# Patient Record
Sex: Male | Born: 1990
Health system: Southern US, Community
[De-identification: ages and names within clinical notes are randomized; demographics above are authoritative.]

## PROBLEM LIST (undated history)

## (undated) DIAGNOSIS — J020 Streptococcal pharyngitis: Secondary | ICD-10-CM

## (undated) DIAGNOSIS — F1910 Other psychoactive substance abuse, uncomplicated: Secondary | ICD-10-CM

## (undated) DIAGNOSIS — W57XXXA Bitten or stung by nonvenomous insect and other nonvenomous arthropods, initial encounter: Secondary | ICD-10-CM

## (undated) DIAGNOSIS — R51 Headache: Secondary | ICD-10-CM

## (undated) DIAGNOSIS — F419 Anxiety disorder, unspecified: Secondary | ICD-10-CM

## (undated) DIAGNOSIS — N172 Acute kidney failure with medullary necrosis: Secondary | ICD-10-CM

## (undated) DIAGNOSIS — R519 Headache, unspecified: Secondary | ICD-10-CM

## (undated) DIAGNOSIS — F329 Major depressive disorder, single episode, unspecified: Secondary | ICD-10-CM

## (undated) DIAGNOSIS — N200 Calculus of kidney: Secondary | ICD-10-CM

## (undated) DIAGNOSIS — G43909 Migraine, unspecified, not intractable, without status migrainosus: Secondary | ICD-10-CM

## (undated) DIAGNOSIS — F32A Depression, unspecified: Secondary | ICD-10-CM

## (undated) DIAGNOSIS — G4700 Insomnia, unspecified: Secondary | ICD-10-CM

## (undated) HISTORY — DX: Streptococcal pharyngitis: J02.0

## (undated) HISTORY — DX: Anxiety disorder, unspecified: F41.9

## (undated) HISTORY — DX: Headache, unspecified: R51.9

## (undated) HISTORY — DX: Migraine, unspecified, not intractable, without status migrainosus: G43.909

## (undated) HISTORY — PX: RECONSTRUCTION OF NOSE: SHX2301

## (undated) HISTORY — PX: KIDNEY SURGERY: SHX687

## (undated) HISTORY — DX: Bitten or stung by nonvenomous insect and other nonvenomous arthropods, initial encounter: W57.XXXA

## (undated) HISTORY — DX: Headache: R51

## (undated) HISTORY — DX: Other psychoactive substance abuse, uncomplicated: F19.10

## (undated) HISTORY — PX: HAND SURGERY: SHX662

## (undated) HISTORY — DX: Depression, unspecified: F32.A

## (undated) HISTORY — DX: Insomnia, unspecified: G47.00

## (undated) HISTORY — DX: Major depressive disorder, single episode, unspecified: F32.9

---
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
## 2013-01-28 ENCOUNTER — Ambulatory Visit: Payer: Self-pay | Admitting: Family Medicine

## 2014-05-13 ENCOUNTER — Ambulatory Visit: Payer: Self-pay | Admitting: Internal Medicine

## 2015-02-04 ENCOUNTER — Ambulatory Visit: Admit: 2015-02-04 | Disposition: A | Discharge status: Home / Self Care | Payer: Self-pay | Admitting: Family Medicine

## 2016-11-06 ENCOUNTER — Ambulatory Visit
Admission: EM | Admit: 2016-11-06 | Discharge: 2016-11-06 | Disposition: A | Discharge status: Home / Self Care | Payer: Federal, State, Local not specified - PPO | Attending: Family Medicine | Admitting: Family Medicine

## 2016-11-06 DIAGNOSIS — R109 Unspecified abdominal pain: Secondary | ICD-10-CM

## 2016-11-06 HISTORY — DX: Calculus of kidney: N20.0

## 2016-11-06 LAB — URINALYSIS, COMPLETE (UACMP) WITH MICROSCOPIC
BACTERIA UA: NONE SEEN
Bilirubin Urine: NEGATIVE
GLUCOSE, UA: NEGATIVE mg/dL
Ketones, ur: NEGATIVE mg/dL
NITRITE: NEGATIVE
Protein, ur: NEGATIVE mg/dL
Specific Gravity, Urine: 1.015 (ref 1.005–1.030)
Squamous Epithelial / LPF: NONE SEEN
pH: 8.5 — ABNORMAL HIGH (ref 5.0–8.0)

## 2016-11-06 MED ORDER — ONDANSETRON 8 MG PO TBDP: 8.0000 mg | ORAL_TABLET | Freq: Two times a day (BID) | ORAL | 0 refills | Status: DC

## 2016-11-06 MED ORDER — HYDROCODONE-ACETAMINOPHEN 5-325 MG PO TABS: 1.0000 | ORAL_TABLET | Freq: Four times a day (QID) | ORAL | 0 refills | Status: DC | PRN

## 2016-11-06 MED ORDER — TAMSULOSIN HCL 0.4 MG PO CAPS: 0.4000 mg | ORAL_CAPSULE | Freq: Every day | ORAL | 0 refills | Status: DC

## 2016-11-06 NOTE — ED Triage Notes (Signed)
Pt has a hx of kidney stones and is being followed by Dr Achilles Dunkope, he was advised he if had any pain to visit an ED or Urgent Care, he woke this am in pain. Bilateral stones, however the left is the worse.

## 2016-11-06 NOTE — ED Provider Notes (Signed)
CSN: [id number]     Arrival date & time 11/06/16  0906 History   None    Chief Complaint  Patient presents with  . Flank Pain   (Consider location/radiation/quality/duration/timing/severity/associated sxs/prior Treatment) HPI  This a 26 year old male who has a history of kidney stones and is followed by Dr. Achilles Dunkope. Lately he has had bilateral flank pain. Dr. Achilles Dunkope was going to schedule a lithotripsy but it was dependent on the patient's ability to the excuse from work. He states that he awoke this morning with pain and per Dr. copes instructions came to the urgent care. His pain is left sided primarily. He has had no fever or chills. He's had some nausea and vomiting with 3-4 episodes since 8 AM this morning. He has been told not to have NSAIDs.Recent (10/30/2016)  CT of the abdomen pelvis - stone protocol of multiple bilateral upper and lower pole renal stones calculi measuring 0.4 cm the right kidney upper pole and up to.0.7 centimeters and the left kidney lower pole. No hydronephrosis.      Past Medical History:  Diagnosis Date  . Kidney stones    Past Surgical History:  Procedure Laterality Date  . HAND SURGERY Right    History reviewed. No pertinent family history. Social History  Substance Use Topics  . Smoking status: Current Every Day Smoker    Packs/day: 0.50    Types: Cigarettes  . Smokeless tobacco: Never Used  . Alcohol use Yes    Review of Systems  Constitutional: Positive for activity change. Negative for chills, fatigue and fever.  Gastrointestinal: Positive for nausea and vomiting.  Genitourinary: Positive for flank pain.  All other systems reviewed and are negative.   Allergies  Lunesta [eszopiclone]  Home Medications   Prior to Admission medications   Medication Sig Start Date End Date Taking? Authorizing Provider  promethazine (PHENERGAN) 12.5 MG tablet Take 12.5 mg by mouth every 4 (four) hours as needed for nausea or vomiting.   Yes Historical Provider,  MD  HYDROcodone-acetaminophen (NORCO/VICODIN) 5-325 MG tablet Take 1-2 tablets by mouth every 6 (six) hours as needed. 11/06/16   Lutricia FeilWilliam P Roemer, PA-C  ondansetron (ZOFRAN ODT) 8 MG disintegrating tablet Take 1 tablet (8 mg total) by mouth 2 (two) times daily. 11/06/16   Lutricia FeilWilliam P Roemer, PA-C  tamsulosin (FLOMAX) 0.4 MG CAPS capsule Take 1 capsule (0.4 mg total) by mouth daily. 11/06/16   Lutricia FeilWilliam P Roemer, PA-C   Meds Ordered and Administered this Visit  Medications - No data to display  BP 118/69 (BP Location: Left Arm)   Pulse 85   Temp 98.7 F (37.1 C) (Oral)   Resp 18   Ht 5\' 9"  (1.753 m)   Wt 125 lb (56.7 kg)   SpO2 100%   BMI 18.46 kg/m  No data found.   Physical Exam  Constitutional: He is oriented to person, place, and time. He appears well-developed and well-nourished. No distress.  HENT:  Head: Normocephalic and atraumatic.  Right Ear: External ear normal.  Left Ear: External ear normal.  Nose: Nose normal.  Mouth/Throat: Oropharynx is clear and moist. No oropharyngeal exudate.  Eyes: EOM are normal. Pupils are equal, round, and reactive to light. Right eye exhibits no discharge. Left eye exhibits no discharge.  Neck: Normal range of motion. Neck supple.  Cardiovascular: Normal rate, regular rhythm and intact distal pulses.  Exam reveals no gallop and no friction rub.   Murmur heard. Pulmonary/Chest: Effort normal and breath sounds normal. No respiratory  distress. He has no wheezes. He has no rales.  Abdominal: Soft. Bowel sounds are normal. He exhibits no distension and no mass. There is no tenderness. There is no rebound and no guarding.  Patient has mild CVA tenderness bilaterally.  Musculoskeletal: Normal range of motion.  Lymphadenopathy:    He has no cervical adenopathy.  Neurological: He is alert and oriented to person, place, and time.  Skin: Skin is warm and dry. He is not diaphoretic.  Psychiatric: He has a normal mood and affect. His behavior is normal.  Judgment and thought content normal.  Nursing note and vitals reviewed.   Urgent Care Course     Procedures (including critical care time)  Labs Review Labs Reviewed  URINALYSIS, COMPLETE (UACMP) WITH MICROSCOPIC - Abnormal; Notable for the following:       Result Value   Color, Urine STRAW (*)    pH 8.5 (*)    Hgb urine dipstick MODERATE (*)    Leukocytes, UA TRACE (*)    All other components within normal limits    Imaging Review No results found.   Visual Acuity Review  Right Eye Distance:   Left Eye Distance:   Bilateral Distance:    Right Eye Near:   Left Eye Near:    Bilateral Near:         MDM   1. Right flank pain   2. Left flank pain    Discharge Medication List as of 11/06/2016 10:46 AM    START taking these medications   Details  HYDROcodone-acetaminophen (NORCO/VICODIN) 5-325 MG tablet Take 1-2 tablets by mouth every 6 (six) hours as needed., Starting Mon 11/06/2016, Print    ondansetron (ZOFRAN ODT) 8 MG disintegrating tablet Take 1 tablet (8 mg total) by mouth 2 (two) times daily., Starting Mon 11/06/2016, Normal      Plan: 1. Test/x-ray results and diagnosis reviewed with patient 2. rx as per orders; risks, benefits, potential side effects reviewed with patient 3. Recommend supportive treatment with Increased hydration and rest. She will call Dr. Achilles Dunk his urologist today for scheduling of the lithotripsy and instructions. He has increasing pain or develops fever chills he should be seen in the emergency department immediately 4. F/u prn if symptoms worsen or don't improve     Lutricia Feil, PA-C 11/06/16 1058

## 2017-08-23 ENCOUNTER — Ambulatory Visit
Admission: EM | Admit: 2017-08-23 | Discharge: 2017-08-23 | Disposition: A | Discharge status: Home / Self Care | Payer: Federal, State, Local not specified - PPO | Attending: Family Medicine | Admitting: Family Medicine

## 2017-08-23 ENCOUNTER — Other Ambulatory Visit: Payer: Self-pay

## 2017-08-23 DIAGNOSIS — J029 Acute pharyngitis, unspecified: Secondary | ICD-10-CM | POA: Diagnosis not present

## 2017-08-23 DIAGNOSIS — J069 Acute upper respiratory infection, unspecified: Secondary | ICD-10-CM | POA: Diagnosis not present

## 2017-08-23 DIAGNOSIS — J329 Chronic sinusitis, unspecified: Secondary | ICD-10-CM

## 2017-08-23 DIAGNOSIS — R05 Cough: Secondary | ICD-10-CM | POA: Diagnosis not present

## 2017-08-23 DIAGNOSIS — B9789 Other viral agents as the cause of diseases classified elsewhere: Secondary | ICD-10-CM | POA: Diagnosis not present

## 2017-08-23 DIAGNOSIS — J342 Deviated nasal septum: Secondary | ICD-10-CM

## 2017-08-23 HISTORY — DX: Acute kidney failure with medullary necrosis: N17.2

## 2017-08-23 LAB — RAPID STREP SCREEN (MED CTR MEBANE ONLY): Streptococcus, Group A Screen (Direct): NEGATIVE

## 2017-08-23 MED ORDER — OSELTAMIVIR PHOSPHATE 75 MG PO CAPS: 75.0000 mg | ORAL_CAPSULE | Freq: Two times a day (BID) | ORAL | 0 refills | Status: DC

## 2017-08-23 MED ORDER — BENZONATATE 100 MG PO CAPS: 100.0000 mg | ORAL_CAPSULE | Freq: Three times a day (TID) | ORAL | 0 refills | Status: DC

## 2017-08-23 MED ORDER — CETIRIZINE HCL 10 MG PO TABS: 10.0000 mg | ORAL_TABLET | Freq: Every day | ORAL | 0 refills | Status: DC

## 2017-08-23 NOTE — ED Triage Notes (Signed)
Patient complains of sore throat, fever, chills that started last night. Patient states that he has kidney disease and is unable to mucinex.

## 2017-08-23 NOTE — Discharge Instructions (Signed)
-  symptoms likely viral in nature and possibly related to outside activity -cetirizine: one tablet daily for symptoms -Tamiflu: one tablet twice a day for 5 days -Tessalon: one every 8 hours as needed for cough -rest and fluids -return to clinic as needed should symptoms worsen or not improve

## 2017-08-23 NOTE — ED Provider Notes (Signed)
[facility]    CSN: [id number] Arrival date & time: 08/23/17  [id number]     History   Chief Complaint Chief Complaint  Patient presents with  . Sore Throat    HPI David [name] is a 26 y.o. male.   Pt is a 26 year old male with PMH of renal disease and heart murmur who presents with complaints of sore throat, headache, and chills that started yesterday afternoon. 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 Patient denies any fever or shortness of breath. He does report some left ear discomfort/popping also reports a cough with some runny nose but no congestion. Does report some drainage as well. Patient does report some mild chest pain he describes as mild heartburn-like.  Patient has allergies Lunesta and is unable take guaifenesin due to his renal disease.      Past Medical History:  Diagnosis Date  . Kidney papillary necrosis (HCC)   . Kidney stones     There are no active problems to display for this patient.   Past Surgical History:  Procedure Laterality Date  . HAND SURGERY Right   . RECONSTRUCTION OF NOSE         Home Medications    Prior to Admission medications   Medication Sig Start Date End Date Taking? Authorizing Provider  benzonatate (TESSALON) 100 MG capsule Take 1 capsule (100 mg total) every 8 (eight) hours by mouth. 08/23/17   Candis SchatzHarris, Kooper Chriswell D, PA-C  cetirizine (ZYRTEC) 10 MG tablet Take 1 tablet (10 mg total) daily by mouth. 08/23/17   Candis SchatzHarris, Cagney Steenson D, PA-C  oseltamivir (TAMIFLU) 75 MG capsule Take 1 capsule (75 mg total) every 12 (twelve) hours by mouth. 08/23/17   Candis SchatzHarris, Shaday Rayborn D, PA-C    Family History History reviewed. No pertinent family history.  Social History Social History   Tobacco Use  . Smoking status: Current Every Day Smoker    Packs/day: 0.50    Types: Cigarettes  . Smokeless tobacco: Never Used  Substance Use Topics  . Alcohol use: Yes    Comment: socially  . Drug  use: No     Allergies   Lunesta [eszopiclone]   Review of Systems Review of Systems  As noted above in history of present illness. Other systems reviewed and found to be negative.   Physical Exam Triage Vital Signs ED Triage Vitals  Enc Vitals Group     BP 08/23/17 0828 126/90     Pulse Rate 08/23/17 0828 78     Resp 08/23/17 0828 18     Temp 08/23/17 0828 98.2 F (36.8 C)     Temp Source 08/23/17 0828 Oral     SpO2 08/23/17 0828 100 %     Weight 08/23/17 0825 125 lb (56.7 kg)     Height 08/23/17 0825 5\' 9"  (1.753 m)     Head Circumference --      Peak Flow --      Pain Score 08/23/17 0825 6     Pain Loc --      Pain Edu? --      Excl. in GC? --    No data found.  Updated Vital Signs BP 126/90 (BP Location: Right Arm)   Pulse 78   Temp 98.2 F (36.8 C) (Oral)   Resp 18   Ht 5\' 9"  (1.753 m)   Wt 125 lb (56.7 kg)   SpO2 100%   BMI 18.46 kg/m   Visual  Acuity Right Eye Distance:   Left Eye Distance:   Bilateral Distance:    Right Eye Near:   Left Eye Near:    Bilateral Near:     Physical Exam  Constitutional: He appears well-developed and well-nourished. He does not appear ill. No distress.  HENT:  Head: Normocephalic and atraumatic.  Mouth/Throat: Mucous membranes are normal. Posterior oropharyngeal erythema present.  Bilateral bulging TMs. Positive postnasal drainage. Some maxillary and frontal sinus pressure.  Eyes: EOM are normal. Pupils are equal, round, and reactive to light.  Neck: Neck supple.  Cardiovascular: Normal rate and regular rhythm.  Murmur heard. Pulmonary/Chest: Effort normal. No respiratory distress. He has no wheezes. He has no rhonchi.  Abdominal: Soft.  Lymphadenopathy:    He has cervical adenopathy (n the left).  Neurological: He is alert.  Skin: Skin is warm and dry. Capillary refill takes less than 2 seconds.     UC Treatments / Results  Labs (all labs ordered are listed, but only abnormal results are displayed) Labs  Reviewed  RAPID STREP SCREEN (NOT AT [facility])  CULTURE, GROUP A STREP [facility])    EKG  EKG Interpretation None       Radiology No results found.  Procedures Procedures (including critical care time)  Medications Ordered in UC Medications - No data to display   Initial Impression / Assessment and Plan / UC Course  I have reviewed the triage vital signs and the nursing notes.  Pertinent labs & imaging results that were available during my care of the patient were reviewed by me and considered in my medical decision making (see chart for details).    Pt with URI symptoms x one day. Strep negative.   Final Clinical Impressions(s) / UC Diagnoses   Final diagnoses:  Viral URI with cough  Sore throat   Will give prescription to cover his cough and upper respiratory symptoms. Also going give him a prescription for Tamiflu. Also recommend rest and fluids. Patient return to clinic should his symptoms worsen or not improve.  ED Discharge Orders        Ordered    cetirizine (ZYRTEC) 10 MG tablet  Daily     08/23/17 0849    oseltamivir (TAMIFLU) 75 MG capsule  Every 12 hours     08/23/17 0850    benzonatate (TESSALON) 100 MG capsule  Every 8 hours     08/23/17 0851       Controlled Substance Prescriptions [place] Controlled Substance Registry consulted? Not Applicable   Candis SchatzHarris, Carmina Walle D, PA-C 08/23/17 [id number]

## 2017-08-25 LAB — CULTURE, GROUP A STREP (THRC)

## 2017-09-03 ENCOUNTER — Telehealth: Payer: Self-pay

## 2017-09-03 NOTE — Telephone Encounter (Signed)
Patient called in for lab results. Lab results given to patient and patient little improvement. Offered antibiotic for + strep but patient declined since he did not feel it was an emergency. [facility]

## 2017-10-01 ENCOUNTER — Encounter: Payer: Self-pay | Admitting: Emergency Medicine

## 2017-10-01 ENCOUNTER — Other Ambulatory Visit: Payer: Self-pay

## 2017-10-01 ENCOUNTER — Emergency Department
Admission: EM | Admit: 2017-10-01 | Discharge: 2017-10-01 | Disposition: A | Discharge status: Home / Self Care | Attending: Emergency Medicine | Admitting: Emergency Medicine

## 2017-10-01 DIAGNOSIS — Y929 Unspecified place or not applicable: Secondary | ICD-10-CM | POA: Diagnosis not present

## 2017-10-01 DIAGNOSIS — Y990 Civilian activity done for income or pay: Secondary | ICD-10-CM | POA: Insufficient documentation

## 2017-10-01 DIAGNOSIS — S0181XA Laceration without foreign body of other part of head, initial encounter: Secondary | ICD-10-CM | POA: Diagnosis not present

## 2017-10-01 DIAGNOSIS — T148XXA Other injury of unspecified body region, initial encounter: Secondary | ICD-10-CM

## 2017-10-01 DIAGNOSIS — Y9389 Activity, other specified: Secondary | ICD-10-CM | POA: Diagnosis not present

## 2017-10-01 DIAGNOSIS — Z23 Encounter for immunization: Secondary | ICD-10-CM | POA: Insufficient documentation

## 2017-10-01 DIAGNOSIS — F17210 Nicotine dependence, cigarettes, uncomplicated: Secondary | ICD-10-CM | POA: Insufficient documentation

## 2017-10-01 DIAGNOSIS — S0993XA Unspecified injury of face, initial encounter: Secondary | ICD-10-CM | POA: Diagnosis present

## 2017-10-01 DIAGNOSIS — W540XXA Bitten by dog, initial encounter: Secondary | ICD-10-CM | POA: Diagnosis not present

## 2017-10-01 DIAGNOSIS — Z79899 Other long term (current) drug therapy: Secondary | ICD-10-CM | POA: Diagnosis not present

## 2017-10-01 MED ORDER — ACETAMINOPHEN 325 MG PO TABS
650.0000 mg | ORAL_TABLET | Freq: Once | ORAL | Status: AC
  Administered 2017-10-01: 650 mg via ORAL
  Filled 2017-10-01: qty 2

## 2017-10-01 MED ORDER — TETANUS-DIPHTH-ACELL PERTUSSIS 5-2.5-18.5 LF-MCG/0.5 IM SUSP
0.5000 mL | Freq: Once | INTRAMUSCULAR | Status: AC
  Administered 2017-10-01: 0.5 mL via INTRAMUSCULAR
  Filled 2017-10-01: qty 0.5

## 2017-10-01 MED ORDER — AMOXICILLIN-POT CLAVULANATE 875-125 MG PO TABS: 1.0000 | ORAL_TABLET | Freq: Two times a day (BID) | ORAL | 0 refills | Status: AC

## 2017-10-01 MED ORDER — LIDOCAINE-EPINEPHRINE-TETRACAINE (LET) SOLUTION
NASAL | Status: AC
  Filled 2017-10-01: qty 3

## 2017-10-01 MED ORDER — LIDOCAINE-EPINEPHRINE-TETRACAINE (LET) SOLUTION: 3.0000 mL | Freq: Once | NASAL | Status: DC

## 2017-10-01 NOTE — ED Provider Notes (Signed)
[facility] Emergency Department Provider Note  ____________________________________________  Time seen: Approximately 5:04 PM  I have reviewed the triage vital signs and the nursing notes.   HISTORY  Chief Complaint WC Animal Bite    HPI David [name] is a 26 y.o. male presents to the emergency department with 1 facial laceration and to facial abrasions after being bitten by a great Dane. The dog in question was able to be detained and observed for signs of rabies.  Patient is complaining of mild right-sided jaw pain but states that he "does not want x-rays or CT.  He denies changes in sensation.  No alleviating measures have been attempted.   Past Medical History:  Diagnosis Date  . Kidney papillary necrosis (HCC)   . Kidney stones     There are no active problems to display for this patient.   Past Surgical History:  Procedure Laterality Date  . HAND SURGERY Right   . RECONSTRUCTION OF NOSE      Prior to Admission medications   Medication Sig Start Date End Date Taking? Authorizing Provider  amoxicillin-clavulanate (AUGMENTIN) 875-125 MG tablet Take 1 tablet by mouth 2 (two) times daily for 10 days. 10/01/17 10/11/17  Orvil FeilWoods, Afnan Cadiente M, PA-C  benzonatate (TESSALON) 100 MG capsule Take 1 capsule (100 mg total) every 8 (eight) hours by mouth. 08/23/17   Candis SchatzHarris, Michael D, PA-C  cetirizine (ZYRTEC) 10 MG tablet Take 1 tablet (10 mg total) daily by mouth. 08/23/17   Candis SchatzHarris, Michael D, PA-C  oseltamivir (TAMIFLU) 75 MG capsule Take 1 capsule (75 mg total) every 12 (twelve) hours by mouth. 08/23/17   Candis SchatzHarris, Michael D, PA-C    Allergies Lunesta [eszopiclone]  No family history on file.  Social History Social History   Tobacco Use  . Smoking status: Current Every Day Smoker    Packs/day: 0.50    Types: Cigarettes  . Smokeless tobacco: Never Used  Substance Use Topics  . Alcohol use: Yes    Comment: socially  . Drug use: No     Review  of Systems  Constitutional: No fever/chills Eyes: No visual changes. No discharge ENT: No upper respiratory complaints. Cardiovascular: no chest pain. Respiratory: no cough. No SOB. Gastrointestinal: No abdominal pain.  No nausea, no vomiting.  No diarrhea.  No constipation. Musculoskeletal: Negative for musculoskeletal pain. Skin: Negative for rash, abrasions, lacerations, ecchymosis. Neurological: Negative for headaches, focal weakness or numbness.   ____________________________________________   PHYSICAL EXAM:  VITAL SIGNS: ED Triage Vitals [10/01/17 1553]  Enc Vitals Group     BP (!) 146/86     Pulse Rate 85     Resp 18     Temp 98 F (36.7 C)     Temp Source Oral     SpO2 100 %     Weight 125 lb (56.7 kg)     Height      Head Circumference      Peak Flow      Pain Score 6     Pain Loc      Pain Edu?      Excl. in GC?      Constitutional: Alert and oriented. Well appearing and in no acute distress. Eyes: Conjunctivae are normal. PERRL. EOMI. Head: Atraumatic. ENT:      Ears: TMs are pearly bilaterally.       Nose: No congestion/rhinnorhea.      Mouth/Throat: Mucous membranes are moist.  Hematological/Lymphatic/Immunilogical: No cervical lymphadenopathy. Cardiovascular: Normal rate, regular rhythm.  Normal S1 and S2.  Good peripheral circulation. Respiratory: Normal respiratory effort without tachypnea or retractions. Lungs CTAB. Good air entry to the bases with no decreased or absent breath sounds. Musculoskeletal: Full range of motion to all extremities. No gross deformities appreciated. Neurologic:  Normal speech and language. No gross focal neurologic deficits are appreciated.  Skin: Patient has 2 facial abrasions and one puncture along the right face.  ____________________________________________   LABS (all labs ordered are listed, but only abnormal results are displayed)  Labs Reviewed - No data to  display ____________________________________________  EKG   ____________________________________________  RADIOLOGY   No results found.  ____________________________________________    PROCEDURES  Procedure(s) performed:    Procedures  LACERATION REPAIR Performed by: Orvil FeilJaclyn M Lashia Niese Authorized by: Orvil FeilJaclyn M Hagan Maltz Consent: Verbal consent obtained. Risks and benefits: risks, benefits and alternatives were discussed Consent given by: patient Patient identity confirmed: provided demographic data Prepped and Draped in normal sterile fashion Wound explored  Laceration Location: Right jaw  Laceration Length: 1 cm  No Foreign Bodies seen or palpated  Anesthesia: local infiltration  Local anesthetic: LET  Anesthetic total: 3 ml  Irrigation method: syringe Amount of cleaning: standard  Skin closure: 5-0 Ethilon   Number of sutures: 3  Technique: Simple Interrupted   Patient tolerance: Patient tolerated the procedure well with no immediate complications.   Medications  lidocaine-EPINEPHrine-tetracaine (LET) solution (not administered)  Tdap (BOOSTRIX) injection 0.5 mL (0.5 mLs Intramuscular Given 10/01/17 1802)  acetaminophen (TYLENOL) tablet 650 mg (650 mg Oral Given 10/01/17 1807)     ____________________________________________   INITIAL IMPRESSION / ASSESSMENT AND PLAN / ED COURSE  Pertinent labs & imaging results that were available during my care of the patient were reviewed by me and considered in my medical decision making (see chart for details).  Review of the [place] CSRS was performed in accordance of the [facility] prior to dispensing any controlled drugs.     Assessment and Plan: Animal Bite  Patient presents to the emergency department with a dog bite sustained to the face.  Patient underwent laceration repair without complication.  He was discharged with Augmentin and advised to follow-up with primary care for suture removal in 5 days.  Tetanus  status was updated in the ED.  Animal involved in dog bite can be detained for observation and rabies vaccination is not necessary at this time.  Return precautions were given.  All patient questions were answered.   ____________________________________________  FINAL CLINICAL IMPRESSION(S) / ED DIAGNOSES  Final diagnoses:  Animal bite      NEW MEDICATIONS STARTED DURING THIS VISIT:  ED Discharge Orders        Ordered    amoxicillin-clavulanate (AUGMENTIN) 875-125 MG tablet  2 times daily     10/01/17 1740          This chart was dictated using voice recognition software/Dragon. Despite best efforts to proofread, errors can occur which can change the meaning. Any change was purely unintentional.    Orvil FeilWoods, Dontrey Snellgrove M, PA-C 10/01/17 Vallery [id number]    Quale, Mark, MD 10/11/17 [id number]

## 2017-10-01 NOTE — ED Notes (Signed)
Pt reports that today while working he was bitten in the face by a [place]. Family at bedside. Pt believes dog shots are UTD. Pt has scar on right side of face on cheek by mouth

## 2017-10-01 NOTE — ED Triage Notes (Signed)
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 Pt states he believes dog is UTD on vaccines. [employer] contacting [employer] to initiate report. Pt presents with wound to right cheek and chin. Bleeding controlled. Ambulatory to triage. No apparent distress noted.

## 2017-10-02 ENCOUNTER — Telehealth: Payer: Self-pay | Admitting: Emergency Medicine

## 2017-10-02 NOTE — Telephone Encounter (Signed)
Pt seen here yesterday, called today requesting work note for tomorrow. Returned call and left message to return my call.

## 2017-10-31 ENCOUNTER — Telehealth: Payer: Self-pay | Admitting: Internal Medicine

## 2017-10-31 NOTE — Telephone Encounter (Signed)
Please advise 

## 2017-10-31 NOTE — Telephone Encounter (Signed)
Copied from CRM [id number]. Topic: Quick Communication - See Telephone Encounter >> Oct 31, 2017 10:23 AM Rudi CocoLathan, Bayani Renteria M, NT wrote: CRM for notification. See Telephone encounter for:   10/31/17. Pt. Calling to see if he will be able to become a new pt. Of Dr. Darrick Huntsmanullo his spouse is a pt. Of hers. Pt. Has untied health care insurance pt. Can be reached at [phone]

## 2017-11-06 NOTE — Telephone Encounter (Signed)
Pt is wanting to see if he can become a pt of yours? Pt's spouse is a pt of yours, Rayvon CharMeghan Norris.

## 2017-11-06 NOTE — Telephone Encounter (Signed)
No more patients at this time sorry

## 2017-11-06 NOTE — Telephone Encounter (Signed)
Spoke with pt and informed him that Dr. Darrick Huntsmanullo is not accepting pt's at this time. The pt stated that he would like to establish care with Dr. Shirlee LatchMclean.

## 2017-12-17 ENCOUNTER — Ambulatory Visit (INDEPENDENT_AMBULATORY_CARE_PROVIDER_SITE_OTHER): Payer: 59 | Admitting: Internal Medicine

## 2017-12-17 ENCOUNTER — Encounter: Payer: Self-pay | Admitting: Internal Medicine

## 2017-12-17 VITALS — BP 128/84 | HR 77 | Temp 97.9°F | Ht 69.0 in | Wt 139.8 lb

## 2017-12-17 DIAGNOSIS — N172 Acute kidney failure with medullary necrosis: Secondary | ICD-10-CM

## 2017-12-17 DIAGNOSIS — E559 Vitamin D deficiency, unspecified: Secondary | ICD-10-CM | POA: Diagnosis not present

## 2017-12-17 DIAGNOSIS — Z1159 Encounter for screening for other viral diseases: Secondary | ICD-10-CM

## 2017-12-17 DIAGNOSIS — G4700 Insomnia, unspecified: Secondary | ICD-10-CM

## 2017-12-17 DIAGNOSIS — F5104 Psychophysiologic insomnia: Secondary | ICD-10-CM | POA: Insufficient documentation

## 2017-12-17 DIAGNOSIS — Z13818 Encounter for screening for other digestive system disorders: Secondary | ICD-10-CM | POA: Diagnosis not present

## 2017-12-17 DIAGNOSIS — R748 Abnormal levels of other serum enzymes: Secondary | ICD-10-CM

## 2017-12-17 DIAGNOSIS — Z1329 Encounter for screening for other suspected endocrine disorder: Secondary | ICD-10-CM | POA: Diagnosis not present

## 2017-12-17 DIAGNOSIS — F1911 Other psychoactive substance abuse, in remission: Secondary | ICD-10-CM

## 2017-12-17 DIAGNOSIS — F419 Anxiety disorder, unspecified: Secondary | ICD-10-CM | POA: Insufficient documentation

## 2017-12-17 DIAGNOSIS — Z13220 Encounter for screening for lipoid disorders: Secondary | ICD-10-CM | POA: Diagnosis not present

## 2017-12-17 DIAGNOSIS — Z87898 Personal history of other specified conditions: Secondary | ICD-10-CM | POA: Diagnosis not present

## 2017-12-17 DIAGNOSIS — I341 Nonrheumatic mitral (valve) prolapse: Secondary | ICD-10-CM

## 2017-12-17 MED ORDER — TEMAZEPAM 15 MG PO CAPS: 15.0000 mg | ORAL_CAPSULE | Freq: Every evening | ORAL | 0 refills | Status: DC | PRN

## 2017-12-17 NOTE — Patient Instructions (Addendum)
sch fasting labs tomorrow  I want to see you in 2-3 weeks   Generalized Anxiety Disorder, Adult Generalized anxiety disorder (GAD) is a mental health disorder. People with this condition constantly worry about everyday events. Unlike normal anxiety, worry related to GAD is not triggered by a specific event. These worries also do not fade or get better with time. GAD interferes with life functions, including relationships, work, and school. GAD can vary from mild to severe. People with severe GAD can have intense waves of anxiety with physical symptoms (panic attacks). What are the causes? The exact cause of GAD is not known. What increases the risk? This condition is more likely to develop in:  Women.  People who have a family history of anxiety disorders.  People who are very shy.  People who experience very stressful life events, such as the death of a loved one.  People who have a very stressful family environment.  What are the signs or symptoms? People with GAD often worry excessively about many things in their lives, such as their health and family. They may also be overly concerned about:  Doing well at work.  Being on time.  Natural disasters.  Friendships.  Physical symptoms of GAD include:  Fatigue.  Muscle tension or having muscle twitches.  Trembling or feeling shaky.  Being easily startled.  Feeling like your heart is pounding or racing.  Feeling out of breath or like you cannot take a deep breath.  Having trouble falling asleep or staying asleep.  Sweating.  Nausea, diarrhea, or irritable bowel syndrome (IBS).  Headaches.  Trouble concentrating or remembering facts.  Restlessness.  Irritability.  How is this diagnosed? Your health care provider can diagnose GAD based on your symptoms and medical history. You will also have a physical exam. The health care provider will ask specific questions about your symptoms, including how severe they are,  when they started, and if they come and go. Your health care provider may ask you about your use of alcohol or drugs, including prescription medicines. Your health care provider may refer you to a mental health specialist for further evaluation. Your health care provider will do a thorough examination and may perform additional tests to rule out other possible causes of your symptoms. To be diagnosed with GAD, a person must have anxiety that:  Is out of his or her control.  Affects several different aspects of his or her life, such as work and relationships.  Causes distress that makes him or her unable to take part in normal activities.  Includes at least three physical symptoms of GAD, such as restlessness, fatigue, trouble concentrating, irritability, muscle tension, or sleep problems.  Before your health care provider can confirm a diagnosis of GAD, these symptoms must be present more days than they are not, and they must last for six months or longer. How is this treated? The following therapies are usually used to treat GAD:  Medicine. Antidepressant medicine is usually prescribed for long-term daily control. Antianxiety medicines may be added in severe cases, especially when panic attacks occur.  Talk therapy (psychotherapy). Certain types of talk therapy can be helpful in treating GAD by providing support, education, and guidance. Options include: ? Cognitive behavioral therapy (CBT). People learn coping skills and techniques to ease their anxiety. They learn to identify unrealistic or negative thoughts and behaviors and to replace them with positive ones. ? Acceptance and commitment therapy (ACT). This treatment teaches people how to be mindful as a  way to cope with unwanted thoughts and feelings. ? Biofeedback. This process trains you to manage your body's response (physiological response) through breathing techniques and relaxation methods. You will work with a therapist while  machines are used to monitor your physical symptoms.  Stress management techniques. These include yoga, meditation, and exercise.  A mental health specialist can help determine which treatment is best for you. Some people see improvement with one type of therapy. However, other people require a combination of therapies. Follow these instructions at home:  Take over-the-counter and prescription medicines only as told by your health care provider.  Try to maintain a normal routine.  Try to anticipate stressful situations and allow extra time to manage them.  Practice any stress management or self-calming techniques as taught by your health care provider.  Do not punish yourself for setbacks or for not making progress.  Try to recognize your accomplishments, even if they are small.  Keep all follow-up visits as told by your health care provider. This is important. Contact a health care provider if:  Your symptoms do not get better.  Your symptoms get worse.  You have signs of depression, such as: ? A persistently sad, cranky, or irritable mood. ? Loss of enjoyment in activities that used to bring you joy. ? Change in weight or eating. ? Changes in sleeping habits. ? Avoiding friends or family members. ? Loss of energy for normal tasks. ? Feelings of guilt or worthlessness. Get help right away if:  You have serious thoughts about hurting yourself or others. If you ever feel like you may hurt yourself or others, or have thoughts about taking your own life, get help right away. You can go to your nearest emergency department or call:  Your local emergency services (911 in the [place]  A suicide crisis helpline, such as the National Suicide Prevention Lifeline at (248)633-3758. This is open 24 hours a day.  Summary  Generalized anxiety disorder (GAD) is a mental health disorder that involves worry that is not triggered by a specific event.  People with GAD often worry  excessively about many things in their lives, such as their health and family.  GAD may cause physical symptoms such as restlessness, trouble concentrating, sleep problems, frequent sweating, nausea, diarrhea, headaches, and trembling or muscle twitching.  A mental health specialist can help determine which treatment is best for you. Some people see improvement with one type of therapy. However, other people require a combination of therapies. This information is not intended to replace advice given to you by your health care provider. Make sure you discuss any questions you have with your health care provider. Document Released: 01/20/2013 Document Revised: 08/15/2016 Document Reviewed: 08/15/2016 Elsevier Interactive Patient Education  2018 [employer].  Insomnia Insomnia is a sleep disorder that makes it difficult to fall asleep or to stay asleep. Insomnia can cause tiredness (fatigue), low energy, difficulty concentrating, mood swings, and poor performance at work or school. There are three different ways to classify insomnia:  Difficulty falling asleep.  Difficulty staying asleep.  Waking up too early in the morning.  Any type of insomnia can be long-term (chronic) or short-term (acute). Both are common. Short-term insomnia usually lasts for three months or less. Chronic insomnia occurs at least three times a week for longer than three months. What are the causes? Insomnia may be caused by another condition, situation, or substance, such as:  Anxiety.  Certain medicines.  Gastroesophageal reflux disease (GERD) or other gastrointestinal  conditions.  Asthma or other breathing conditions.  Restless legs syndrome, sleep apnea, or other sleep disorders.  Chronic pain.  Menopause. This may include hot flashes.  Stroke.  Abuse of alcohol, tobacco, or illegal drugs.  Depression.  Caffeine.  Neurological disorders, such as Alzheimer disease.  An overactive thyroid  (hyperthyroidism).  The cause of insomnia may not be known. What increases the risk? Risk factors for insomnia include:  Gender. Women are more commonly affected than men.  Age. Insomnia is more common as you get older.  Stress. This may involve your professional or personal life.  Income. Insomnia is more common in people with lower income.  Lack of exercise.  Irregular work schedule or night shifts.  Traveling between different time zones.  What are the signs or symptoms? If you have insomnia, trouble falling asleep or trouble staying asleep is the main symptom. This may lead to other symptoms, such as:  Feeling fatigued.  Feeling nervous about going to sleep.  Not feeling rested in the morning.  Having trouble concentrating.  Feeling irritable, anxious, or depressed.  How is this treated? Treatment for insomnia depends on the cause. If your insomnia is caused by an underlying condition, treatment will focus on addressing the condition. Treatment may also include:  Medicines to help you sleep.  Counseling or therapy.  Lifestyle adjustments.  Follow these instructions at home:  Take medicines only as directed by your health care provider.  Keep regular sleeping and waking hours. Avoid naps.  Keep a sleep diary to help you and your health care provider figure out what could be causing your insomnia. Include: ? When you sleep. ? When you wake up during the night. ? How well you sleep. ? How rested you feel the next day. ? Any side effects of medicines you are taking. ? What you eat and drink.  Make your bedroom a comfortable place where it is easy to fall asleep: ? Put up shades or special blackout curtains to block light from outside. ? Use a white noise machine to block noise. ? Keep the temperature cool.  Exercise regularly as directed by your health care provider. Avoid exercising right before bedtime.  Use relaxation techniques to manage stress. Ask  your health care provider to suggest some techniques that may work well for you. These may include: ? Breathing exercises. ? Routines to release muscle tension. ? Visualizing peaceful scenes.  Cut back on alcohol, caffeinated beverages, and cigarettes, especially close to bedtime. These can disrupt your sleep.  Do not overeat or eat spicy foods right before bedtime. This can lead to digestive discomfort that can make it hard for you to sleep.  Limit screen use before bedtime. This includes: ? Watching TV. ? Using your smartphone, tablet, and computer.  Stick to a routine. This can help you fall asleep faster. Try to do a quiet activity, brush your teeth, and go to bed at the same time each night.  Get out of bed if you are still awake after 15 minutes of trying to sleep. Keep the lights down, but try reading or doing a quiet activity. When you feel sleepy, go back to bed.  Make sure that you drive carefully. Avoid driving if you feel very sleepy.  Keep all follow-up appointments as directed by your health care provider. This is important. Contact a health care provider if:  You are tired throughout the day or have trouble in your daily routine due to sleepiness.  You continue to  have sleep problems or your sleep problems get worse. Get help right away if:  You have serious thoughts about hurting yourself or someone else. This information is not intended to replace advice given to you by your health care provider. Make sure you discuss any questions you have with your health care provider. Document Released: 09/22/2000 Document Revised: 02/25/2016 Document Reviewed: 06/26/2014 Elsevier Interactive Patient Education  [employer].

## 2017-12-17 NOTE — Progress Notes (Signed)
Pre visit review using our clinic review tool, if applicable. No additional management support is needed unless otherwise documented below in the visit note. 

## 2017-12-20 ENCOUNTER — Other Ambulatory Visit (INDEPENDENT_AMBULATORY_CARE_PROVIDER_SITE_OTHER): Payer: 59

## 2017-12-20 ENCOUNTER — Other Ambulatory Visit: Payer: Self-pay | Admitting: Internal Medicine

## 2017-12-20 ENCOUNTER — Encounter: Payer: Self-pay | Admitting: Internal Medicine

## 2017-12-20 DIAGNOSIS — Z1329 Encounter for screening for other suspected endocrine disorder: Secondary | ICD-10-CM

## 2017-12-20 DIAGNOSIS — F419 Anxiety disorder, unspecified: Secondary | ICD-10-CM

## 2017-12-20 DIAGNOSIS — R748 Abnormal levels of other serum enzymes: Secondary | ICD-10-CM | POA: Insufficient documentation

## 2017-12-20 DIAGNOSIS — F1911 Other psychoactive substance abuse, in remission: Secondary | ICD-10-CM

## 2017-12-20 DIAGNOSIS — Z13818 Encounter for screening for other digestive system disorders: Secondary | ICD-10-CM

## 2017-12-20 DIAGNOSIS — Z1159 Encounter for screening for other viral diseases: Secondary | ICD-10-CM

## 2017-12-20 DIAGNOSIS — Z0283 Encounter for blood-alcohol and blood-drug test: Secondary | ICD-10-CM

## 2017-12-20 DIAGNOSIS — Z13220 Encounter for screening for lipoid disorders: Secondary | ICD-10-CM

## 2017-12-20 DIAGNOSIS — E559 Vitamin D deficiency, unspecified: Secondary | ICD-10-CM | POA: Diagnosis not present

## 2017-12-20 LAB — CBC WITH DIFFERENTIAL/PLATELET
Basophils Absolute: 0.1 10*3/uL (ref 0.0–0.1)
Basophils Relative: 1 % (ref 0.0–3.0)
Eosinophils Absolute: 0.4 10*3/uL (ref 0.0–0.7)
Eosinophils Relative: 7.6 % — ABNORMAL HIGH (ref 0.0–5.0)
HEMATOCRIT: 50.2 % (ref 39.0–52.0)
Hemoglobin: 17.8 g/dL — ABNORMAL HIGH (ref 13.0–17.0)
LYMPHS PCT: 32.6 % (ref 12.0–46.0)
Lymphs Abs: 1.7 10*3/uL (ref 0.7–4.0)
MCHC: 35.4 g/dL (ref 30.0–36.0)
MCV: 91 fl (ref 78.0–100.0)
Monocytes Absolute: 0.4 10*3/uL (ref 0.1–1.0)
Monocytes Relative: 8 % (ref 3.0–12.0)
Neutro Abs: 2.6 10*3/uL (ref 1.4–7.7)
Neutrophils Relative %: 50.8 % (ref 43.0–77.0)
PLATELETS: 232 10*3/uL (ref 150.0–400.0)
RBC: 5.52 Mil/uL (ref 4.22–5.81)
RDW: 12.4 % (ref 11.5–15.5)
WBC: 5.1 10*3/uL (ref 4.0–10.5)

## 2017-12-20 LAB — COMPREHENSIVE METABOLIC PANEL
ALT: 15 U/L (ref 0–53)
AST: 17 U/L (ref 0–37)
Albumin: 4.8 g/dL (ref 3.5–5.2)
Alkaline Phosphatase: 58 U/L (ref 39–117)
BUN: 16 mg/dL (ref 6–23)
CHLORIDE: 100 meq/L (ref 96–112)
CO2: 33 mEq/L — ABNORMAL HIGH (ref 19–32)
CREATININE: 0.88 mg/dL (ref 0.40–1.50)
Calcium: 10.2 mg/dL (ref 8.4–10.5)
GFR: 110.53 mL/min (ref >60.00)
GLUCOSE: 90 mg/dL (ref 70–99)
Potassium: 4.6 mEq/L (ref 3.5–5.1)
SODIUM: 138 meq/L (ref 135–145)
Total Bilirubin: 1.1 mg/dL (ref 0.2–1.2)
Total Protein: 7.3 g/dL (ref 6.0–8.3)

## 2017-12-20 LAB — LIPID PANEL
Cholesterol: 164 mg/dL (ref 0–200)
HDL: 44.4 mg/dL (ref >39.00)
LDL CALC: 110 mg/dL — AB (ref 0–99)
NonHDL: 119.99
Total CHOL/HDL Ratio: 4
Triglycerides: 51 mg/dL (ref 0.0–149.0)
VLDL: 10.2 mg/dL (ref 0.0–40.0)

## 2017-12-20 LAB — URINALYSIS, ROUTINE W REFLEX MICROSCOPIC
Bilirubin Urine: NEGATIVE
HGB URINE DIPSTICK: NEGATIVE
KETONES UR: NEGATIVE
LEUKOCYTES UA: NEGATIVE
NITRITE: NEGATIVE
RBC / HPF: NONE SEEN (ref 0–?)
Specific Gravity, Urine: 1.01 (ref 1.000–1.030)
TOTAL PROTEIN, URINE-UPE24: NEGATIVE
UROBILINOGEN UA: 0.2 (ref 0.0–1.0)
Urine Glucose: NEGATIVE
pH: 8 (ref 5.0–8.0)

## 2017-12-20 LAB — TSH: TSH: 1.32 u[IU]/mL (ref 0.35–4.50)

## 2017-12-20 LAB — VITAMIN D 25 HYDROXY (VIT D DEFICIENCY, FRACTURES): VITD: 15.36 ng/mL — AB (ref 30.00–100.00)

## 2017-12-20 LAB — T4, FREE: Free T4: 0.89 ng/dL (ref 0.60–1.60)

## 2017-12-20 NOTE — Progress Notes (Signed)
Chief Complaint  Patient presents with  . Establish Care   New patient  1. C/o anxiety and insomnia. He reports at one point on Klonopin. He does not like to take antidepressants. No relief with melatonin, no help with Ambien, Lunesta hives. He reports Buspar caused vertigo and he has also been on Seroquel.   2. H/o substance abuse/addiction used ectasy, [facility] [place] in [place] and admitted to this facility in HS 3. Tobacco abuse he reports quit smoking 5 days ago  4. Of note reviewed CT ab/pelvis 10/2016 + ground glass apperance in lungs.      Review of Systems  Constitutional: Negative for weight loss.  HENT: Negative for hearing loss.   Eyes: Negative for blurred vision.  Respiratory: Negative for shortness of breath.   Cardiovascular: Negative for chest pain.  Gastrointestinal: Negative for abdominal pain.  Musculoskeletal: Negative for joint pain.  Skin: Negative for rash.  Neurological: Negative for headaches.  Psychiatric/Behavioral: Negative for depression. The patient is nervous/anxious and has insomnia.    Past Medical History:  Diagnosis Date  . Anxiety   . Depression   . 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 Headache   . Insomnia   . Kidney papillary necrosis (HCC)   . Kidney stones   . Tick bite    Past Surgical History:  Procedure Laterality Date  . HAND SURGERY Right    2012 right hand has metal plate   . 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 RECONSTRUCTION OF NOSE     2006    Family History  Problem Relation Age of Onset  . Cancer Mother        GU cancer   . Depression Mother   . Hypertension Mother   . Depression Sister   . Kidney disease Sister   . Arthritis Maternal Grandmother   . Cancer Maternal Grandmother        GU cancer   . Alcohol abuse Maternal Grandfather   . Cancer Other        GU cancer    Social  History   Socioeconomic History  . Marital status: Married    Spouse name: Not on file  . Number of children: Not on file  . Years of education: Not on file  . Highest education level: Not on file  Social Needs  . Financial resource strain: Not on file  . Food insecurity - worry: Not on file  . Food insecurity - inability: Not on file  . Transportation needs - medical: Not on file  . Transportation needs - non-medical: Not on file  Occupational History  . Not on file  Tobacco Use  . Smoking status: Current Every Day Smoker    Packs/day: 0.50    Types: Cigarettes  . Smokeless tobacco: Never Used  Substance and Sexual Activity  . Alcohol use: Yes    Comment: socially  . Drug use: No  . Sexual activity: Not on file  Other Topics Concern  . Not on file  Social History Narrative  . Not on file   No outpatient medications have been marked as taking for the 12/17/17 encounter (Office Visit) with McLean-Scocuzza, Pasty Spillersracy N, MD.   Allergies  Allergen Reactions  . Lunesta [Eszopiclone]    No results found for this or any  previous visit (from the past 2160 hour(s)). Objective  Body mass index is 20.64 kg/m. Wt Readings from Last 3 Encounters:  12/17/17 139 lb 12.8 oz (63.4 kg)  10/01/17 125 lb (56.7 kg)  08/23/17 125 lb (56.7 kg)   Temp Readings from Last 3 Encounters:  12/17/17 97.9 F (36.6 C) (Oral)  10/01/17 98 F (36.7 C) (Oral)  08/23/17 98.2 F (36.8 C) (Oral)   BP Readings from Last 3 Encounters:  12/17/17 128/84  10/01/17 (!) 146/86  08/23/17 126/90   Pulse Readings from Last 3 Encounters:  12/17/17 77  10/01/17 85  08/23/17 78   O2 sat room air 97%  Physical Exam  Constitutional: He is oriented to person, place, and time and well-developed, well-nourished, and in no distress. Vital signs are normal.  HENT:  Head: Normocephalic and atraumatic.  Mouth/Throat: Oropharynx is clear and moist and mucous membranes are normal.  Eyes: Conjunctivae are normal.  Pupils are equal, round, and reactive to light.  Cardiovascular: Normal rate and regular rhythm.  Murmur heard. Pulmonary/Chest: Effort normal and breath sounds normal.  Abdominal: Soft. Bowel sounds are normal.  Neurological: He is alert and oriented to person, place, and time. Gait normal. Gait normal.  Skin: Skin is warm, dry and intact.  Psychiatric: Mood, memory, affect and judgment normal.  Nursing note and vitals reviewed.   Assessment   1. Anxiety/insomnia  2. H/o substance abuse/tobacco abuse though quit both per pt most recently cigarrettes 5 days ago  3. CT ab/pelvis 10/2016 with +ground glass appearance  4. Papillary necrosis kidneys  5. HM Plan  1 and 2. Trial of restoril 15 mg qhs not long term  If theses issues continue given #2 will need to see psychiatry  Congratulated on smoking cessation  3. Consider CT chest in future  4. Records need to obtain [facility] renal Dr. Cherly Hensen Declines flu  Tdap had 10/01/17  Check hep B status  Declines STD testing   Labs UDS, CMET, CBC, lipid, UA, vit D, tSH, T4, hep B status   Get records from [facility] Dr. Cherly Hensen and Lorn Junes in [place]  Pt also follows Dr. Achilles Dunk urology kidney stones    Provider: Dr. French Ana McLean-Scocuzza-Internal Medicine

## 2017-12-21 ENCOUNTER — Other Ambulatory Visit: Payer: Self-pay | Admitting: Internal Medicine

## 2017-12-21 ENCOUNTER — Other Ambulatory Visit: Payer: 59

## 2017-12-21 ENCOUNTER — Other Ambulatory Visit: Payer: Worker's Compensation

## 2017-12-21 DIAGNOSIS — F1911 Other psychoactive substance abuse, in remission: Secondary | ICD-10-CM

## 2017-12-21 DIAGNOSIS — Z0283 Encounter for blood-alcohol and blood-drug test: Secondary | ICD-10-CM

## 2017-12-21 DIAGNOSIS — E559 Vitamin D deficiency, unspecified: Secondary | ICD-10-CM

## 2017-12-21 LAB — HEPATITIS B SURFACE ANTIBODY, QUANTITATIVE: Hepatitis B-Post: 11 m[IU]/mL (ref >=10)

## 2017-12-21 LAB — HEPATITIS B SURFACE ANTIGEN: HEP B S AG: NONREACTIVE

## 2017-12-21 MED ORDER — CHOLECALCIFEROL 1.25 MG (50000 UT) PO CAPS: 50000.0000 [IU] | ORAL_CAPSULE | ORAL | 1 refills | Status: DC

## 2017-12-24 ENCOUNTER — Encounter: Payer: Self-pay | Admitting: *Deleted

## 2017-12-26 LAB — PAIN MGMT, PROFILE 8 W/CONF, U
6 ACETYLMORPHINE: NEGATIVE ng/mL (ref <10)
ALPHAHYDROXYMIDAZOLAM: NEGATIVE ng/mL (ref <50)
AMPHETAMINES: NEGATIVE ng/mL (ref <500)
Alcohol Metabolites: NEGATIVE ng/mL (ref <500)
Alphahydroxyalprazolam: 135 ng/mL — ABNORMAL HIGH (ref <25)
Alphahydroxytriazolam: NEGATIVE ng/mL (ref <50)
Aminoclonazepam: NEGATIVE ng/mL (ref <25)
Benzodiazepines: POSITIVE ng/mL — AB (ref <100)
Buprenorphine, Urine: NEGATIVE ng/mL (ref <5)
CREATININE: 88 mg/dL
Cocaine Metabolite: NEGATIVE ng/mL (ref <150)
Hydroxyethylflurazepam: NEGATIVE ng/mL (ref <50)
LORAZEPAM: NEGATIVE ng/mL (ref <50)
MDMA: NEGATIVE ng/mL (ref <500)
Marijuana Metabolite: 105 ng/mL — ABNORMAL HIGH (ref <5)
Marijuana Metabolite: POSITIVE ng/mL — AB (ref <20)
NORDIAZEPAM: NEGATIVE ng/mL (ref <50)
OPIATES: NEGATIVE ng/mL (ref <100)
OXIDANT: NEGATIVE ug/mL (ref <200)
Oxazepam: NEGATIVE ng/mL (ref <50)
Oxycodone: NEGATIVE ng/mL (ref <100)
PH: 7.22 (ref 4.5–9.0)
TEMAZEPAM: NEGATIVE ng/mL (ref <50)

## 2018-01-08 ENCOUNTER — Ambulatory Visit: Payer: 59 | Admitting: Internal Medicine

## 2018-01-08 ENCOUNTER — Encounter: Payer: Self-pay | Admitting: Internal Medicine

## 2018-01-08 VITALS — BP 118/70 | HR 63 | Temp 98.0°F | Resp 15 | Ht 69.0 in | Wt 138.5 lb

## 2018-01-08 DIAGNOSIS — IMO0001 Reserved for inherently not codable concepts without codable children: Secondary | ICD-10-CM | POA: Insufficient documentation

## 2018-01-08 DIAGNOSIS — G4700 Insomnia, unspecified: Secondary | ICD-10-CM

## 2018-01-08 DIAGNOSIS — R918 Other nonspecific abnormal finding of lung field: Secondary | ICD-10-CM

## 2018-01-08 DIAGNOSIS — F419 Anxiety disorder, unspecified: Secondary | ICD-10-CM | POA: Diagnosis not present

## 2018-01-08 DIAGNOSIS — R911 Solitary pulmonary nodule: Secondary | ICD-10-CM

## 2018-01-08 DIAGNOSIS — E559 Vitamin D deficiency, unspecified: Secondary | ICD-10-CM | POA: Diagnosis not present

## 2018-01-08 NOTE — Progress Notes (Addendum)
Chief Complaint  Patient presents with  . Follow-up   F/u  1. Reviewed labs  2. CT ab/pelvis with 5 mm RLL ground glass nodule 10/2016  3. PHQ9 4   Review of Systems  Constitutional: Negative for weight loss.  Respiratory: Negative for shortness of breath.   Cardiovascular: Negative for chest pain.  Skin: Negative for rash.  Psychiatric/Behavioral: The patient is nervous/anxious and has insomnia.    Past Medical History:  Diagnosis Date  . Anxiety   . Depression   . 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 Headache   . Insomnia   . Kidney papillary necrosis (HCC)   . Kidney stones   . Kidney stones    Dr. Achilles Dunk   . Strep throat   . Tick bite    Past Surgical History:  Procedure Laterality Date  . HAND SURGERY Right    2012 right hand has metal plate   . 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 RECONSTRUCTION OF NOSE     2006    Family History  Problem Relation Age of Onset  . Cancer Mother        GU cancer   . Depression Mother   . Hypertension Mother   . Depression Sister   . Kidney disease Sister   . Arthritis Maternal Grandmother   . Cancer Maternal Grandmother        GU cancer   . Alcohol abuse Maternal Grandfather   . Cancer Other        GU cancer    Social History   Socioeconomic History  . Marital status: Married    Spouse name: Not on file  . Number of children: Not on file  . Years of education: Not on file  . Highest education level: Not on file  Occupational History  . Not on file  Social Needs  . Financial resource strain: Not on file  . Food insecurity:    Worry: Not on file    Inability: Not on file  . Transportation needs:    Medical: Not on file    Non-medical: Not on file  Tobacco Use  . Smoking status: Current Every Day Smoker    Packs/day: 0.50    Types: Cigarettes  . Smokeless tobacco: Never Used  Substance and Sexual Activity  .  Alcohol use: Yes    Comment: socially  . Drug use: No  . Sexual activity: Yes  Lifestyle  . Physical activity:    Days per week: Not on file    Minutes per session: Not on file  . Stress: Not on file  Relationships  . Social connections:    Talks on phone: Not on file    Gets together: Not on file    Attends religious service: Not on file    Active member of club or organization: Not on file    Attends meetings of clubs or organizations: Not on file    Relationship status: Not on file  . Intimate partner violence:    Fear of current or ex partner: Not on file    Emotionally abused: Not on file    Physically abused: Not on file    Forced sexual activity: Not on file  Other Topics Concern  . 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   Allergies  Allergen Reactions  . Lunesta [Eszopiclone]    Recent Results (from the past 2160 hour(s))  Hepatitis B surface antigen     Status: None   Collection Time: 12/20/17  8:40 AM  Result Value Ref Range   Hepatitis B Surface Ag NON-REACTIVE NON-REACTI  Hepatitis B surface antibody     Status: None   Collection Time: 12/20/17  8:40 AM  Result Value Ref Range   Hepatitis B-Post 11 > OR = 10 mIU/mL    Comment: . Patient has immunity to hepatitis B virus. . For additional information, please refer to http://education.quest[facility].com/faq/FAQ105 (This link is being provided for informational/ educational purposes only).   Vitamin D (25 hydroxy)     Status: Abnormal   Collection Time: 12/20/17  8:40 AM  Result Value Ref Range   VITD 15.36 (L) 30.00 - 100.00 ng/mL  Urinalysis, Routine w reflex microscopic     Status: None   Collection Time: 12/20/17  8:40 AM   Result Value Ref Range   Color, Urine YELLOW Yellow;Lt. Yellow   APPearance CLEAR Clear   Specific Gravity, Urine 1.010 1.000 - 1.030   pH 8.0 5.0 - 8.0   Total Protein, Urine NEGATIVE Negative   Urine Glucose NEGATIVE Negative   Ketones, ur NEGATIVE Negative   Bilirubin Urine NEGATIVE Negative   Hgb urine dipstick NEGATIVE Negative   Urobilinogen, UA 0.2 0.0 - 1.0   Leukocytes, UA NEGATIVE Negative   Nitrite NEGATIVE Negative   WBC, UA 0-2/hpf 0-2/hpf   RBC / HPF none seen 0-2/hpf   Squamous Epithelial / LPF Rare(0-4/hpf) Rare(0-4/hpf)  TSH     Status: None   Collection Time: 12/20/17  8:40 AM  Result Value Ref Range   TSH 1.32 0.35 - 4.50 uIU/mL  T4, free     Status: None   Collection Time: 12/20/17  8:40 AM  Result Value Ref Range   Free T4 0.89 0.60 - 1.60 ng/dL    Comment: Specimens from patients who are undergoing biotin therapy and /or ingesting biotin supplements may contain high levels of biotin.  The higher biotin concentration in these specimens interferes with this Free T4 assay.  Specimens that contain high levels  of biotin may cause false high results for this Free T4 assay.  Please interpret results in light of the total clinical presentation of the patient.    CBC w/Diff     Status: Abnormal   Collection Time: 12/20/17  8:40 AM  Result Value Ref Range   WBC 5.1 4.0 - 10.5 K/uL   RBC 5.52 4.22 - 5.81 Mil/uL   Hemoglobin 17.8 (H) 13.0 - 17.0 g/dL   HCT 16.150.2 09.639.0 - 04.552.0 %   MCV 91.0 78.0 - 100.0 fl   MCHC 35.4 30.0 - 36.0 g/dL   RDW 40.912.4 81.111.5 - 91.415.5 %   Platelets 232.0 150.0 - 400.0 K/uL   Neutrophils Relative % 50.8 43.0 - 77.0 %   Lymphocytes Relative 32.6 12.0 - 46.0 %   Monocytes Relative 8.0 3.0 - 12.0 %   Eosinophils Relative 7.6 (H) 0.0 - 5.0 %   Basophils Relative 1.0 0.0 - 3.0 %   Neutro Abs 2.6 1.4 -  7.7 K/uL   Lymphs Abs 1.7 0.7 - 4.0 K/uL   Monocytes Absolute 0.4 0.1 - 1.0 K/uL   Eosinophils Absolute 0.4 0.0 - 0.7 K/uL   Basophils Absolute  0.1 0.0 - 0.1 K/uL  Lipid panel     Status: Abnormal   Collection Time: 12/20/17  8:40 AM  Result Value Ref Range   Cholesterol 164 0 - 200 mg/dL    Comment: ATP III Classification       Desirable:  < 200 mg/dL               Borderline High:  200 - 239 mg/dL          High:  > = 161 mg/dL   Triglycerides 09.6 0.0 - 149.0 mg/dL    Comment: Normal:  <045 mg/dLBorderline High:  150 - 199 mg/dL   HDL 40.98 >11.91 mg/dL   VLDL 47.8 0.0 - 29.5 mg/dL   LDL Cholesterol 621 (H) 0 - 99 mg/dL   Total CHOL/HDL Ratio 4     Comment:                Men          Women1/2 Average Risk     3.4          3.3Average Risk          5.0          4.42X Average Risk          9.6          7.13X Average Risk          15.0          11.0                       NonHDL 119.99     Comment: NOTE:  Non-HDL goal should be 30 mg/dL higher than patient's LDL goal (i.e. LDL goal of < 70 mg/dL, would have non-HDL goal of < 100 mg/dL)  Comprehensive metabolic panel     Status: Abnormal   Collection Time: 12/20/17  8:40 AM  Result Value Ref Range   Sodium 138 135 - 145 mEq/L   Potassium 4.6 3.5 - 5.1 mEq/L   Chloride 100 96 - 112 mEq/L   CO2 33 (H) 19 - 32 mEq/L   Glucose, Bld 90 70 - 99 mg/dL   BUN 16 6 - 23 mg/dL   Creatinine, Ser 3.08 0.40 - 1.50 mg/dL   Total Bilirubin 1.1 0.2 - 1.2 mg/dL   Alkaline Phosphatase 58 39 - 117 U/L   AST 17 0 - 37 U/L   ALT 15 0 - 53 U/L   Total Protein 7.3 6.0 - 8.3 g/dL   Albumin 4.8 3.5 - 5.2 g/dL   Calcium 65.7 8.4 - 84.6 mg/dL   GFR 962.95 >28.41 mL/min  Pain Mgmt, Profile 8 w/Conf, U     Status: Abnormal   Collection Time: 12/21/17  8:45 AM  Result Value Ref Range   Creatinine 88.0 > or = 20. mg/dL   pH 3.24 4.5 - 9.0   Oxidant NEGATIVE <200 mcg/mL   Amphetamines NEGATIVE <500 ng/mL   medMATCH Amphetamines CONSISTENT    Benzodiazepines POSITIVE (A) <100 ng/mL   Alphahydroxyalprazolam 135 (H) <25 ng/mL    Comment: See Note 1   medMATCH aOH alprazolam INCONSISTENT     Alphahydroxymidazolam NEGATIVE <50 ng/mL    Comment: See Note 1   medMATCH aOH midazolam CONSISTENT  Alphahydroxytriazolam NEGATIVE <50 ng/mL    Comment: See Note 1   medMATCH aOH triazolam CONSISTENT    Aminoclonazepam NEGATIVE <25 ng/mL    Comment: See Note 1   medMATCH Aminoclonazepam CONSISTENT    Hydroxyethylflurazepam NEGATIVE <50 ng/mL    Comment: See Note 1   medMATCH OH,Et flurazepam CONSISTENT    Lorazepam NEGATIVE <50 ng/mL    Comment: See Note 1   medMATCH Lorazepam CONSISTENT    Nordiazepam NEGATIVE <50 ng/mL    Comment: See Note 1   medMATCH Nordiazepam CONSISTENT    Oxazepam NEGATIVE <50 ng/mL    Comment: See Note 1   medMATCH Oxazepam CONSISTENT    Temazepam NEGATIVE <50 ng/mL    Comment: See Note 1   medMATCH Temazepam CONSISTENT    Marijuana Metabolite POSITIVE (A) <20 ng/mL   Marijuana Metabolite 105 (H) <5 ng/mL    Comment: See Note 1   medMATCH Marijuana Metab INCONSISTENT    Cocaine Metabolite NEGATIVE <150 ng/mL   medMATCH Cocaine Metab CONSISTENT    Opiates NEGATIVE <100 ng/mL   medMATCH Opiates CONSISTENT    Oxycodone NEGATIVE <100 ng/mL   medMATCH Oxycodone CONSISTENT     Comment: Note 1 . This test was developed and its analytical performance  characteristics have been determined by [employer]. It has not been cleared or approved by the FDA. This assay has been validated pursuant to the CLIA  regulations and is used for clinical purposes.    Buprenorphine, Urine NEGATIVE <5 ng/mL   medMATCH Buprenorphine CONSISTENT    MDMA NEGATIVE <500 ng/mL   [facility] MDMA CONSISTENT    Alcohol Metabolites NEGATIVE <500 ng/mL   medMATCH Alcohol Metab CONSISTENT    6 Acetylmorphine NEGATIVE <10 ng/mL   medMATCH 6 Acetylmorphine CONSISTENT     Comment: This drug testing is for medical treatment only.   Analysis was performed as non-forensic testing and  these results should be used only by healthcare  providers to render diagnosis or treatment,  or to  monitor progress of medical conditions. Sharyn Lull comments are:  - present when drug test results may be the result of     metabolism of one or more drugs or when results are     inconsistent with prescribed medication(s) listed.  - may be blank when drug results are consistent with     prescribed medication(s) listed. . For assistance with interpreting these drug results,  please contact a [employer] Toxicology  Specialist: [phone] TOX [phone]), M-F,  8am-6pm EST. This drug testing is for medical treatment only.   Analysis was performed as non-forensic testing and  these results should be used only by healthcare  providers to render diagnosis or treatment, or to  monitor progress of medical conditions. Sharyn Lull comments are:  - present when  drug test results may be the result of     metabolism of one or more drugs or when results are     inconsistent with prescribed medication(s) listed.  - may be blank when drug results are consistent with     prescribed medication(s) listed. . For assistance with interpreting these drug results,  please contact a [employer] Toxicology  Specialist: [phone] TOX [phone]), M-F,  8am-6pm EST. This drug testing is for medical treatment only.   Analysis was performed as non-forensic testing and  these results should be used only by healthcare  providers to render diagnosis or treatment, or to  monitor progress of medical conditions. Sharyn Lull comments  are:  - present when drug test results may be the result of     metabolism of one or more drugs or when results are     inconsistent with prescribed medication(s) listed.  - may be blank when drug results are consistent with     prescribed medication(s) listed. . For assistance with interpreting these d rug results,  please contact a [employer], [employer] Toxicology  Specialist: [phone] TOX [phone]), M-F,  8am-6pm EST. This drug  testing is for medical treatment only.   Analysis was performed as non-forensic testing and  these results should be used only by healthcare  providers to render diagnosis or treatment, or to  monitor progress of medical conditions. Sharyn Lull comments are:  - present when drug test results may be the result of     metabolism of one or more drugs or when results are     inconsistent with prescribed medication(s) listed.  - may be blank when drug results are consistent with     prescribed medication(s) listed. . For assistance with interpreting these drug results,  please contact a [employer] Toxicology  Specialist: [phone] TOX [phone]), M-F,  8am-6pm EST. This drug testing is for medical treatment only.   Analysis was performed as non-forensic testing and  these results should be used only by healthcare  provi ders to render diagnosis or treatment, or to  monitor progress of medical conditions. Sharyn Lull comments are:  - present when drug test results may be the result of     metabolism of one or more drugs or when results are     inconsistent with prescribed medication(s) listed.  - may be blank when drug results are consistent with     prescribed medication(s) listed. . For assistance with interpreting these drug results,  please contact a [employer] Toxicology  Specialist: [phone] TOX [phone]), M-F,  8am-6pm EST.    Objective  Body mass index is 20.45 kg/m. Wt Readings from Last 3 Encounters:  01/08/18 138 lb 8 oz (62.8 kg)  12/17/17 139 lb 12.8 oz (63.4 kg)  10/01/17 125 lb (56.7 kg)   Temp Readings from Last 3 Encounters:  01/08/18 98 F (36.7 C) (Oral)  12/17/17 97.9 F (36.6 C) (Oral)  10/01/17 98 F (36.7 C) (Oral)   BP Readings from Last 3 Encounters:  01/08/18 118/70  12/17/17 128/84  10/01/17 (!) 146/86   Pulse Readings from Last 3 Encounters:  01/08/18 63  12/17/17 77  10/01/17 85    Physical Exam   Constitutional: He is oriented to person, place, and time and well-developed, well-nourished, and in no distress. Vital signs are normal.  HENT:  Head: Normocephalic and atraumatic.  Eyes: Pupils are equal, round, and reactive to light. Conjunctivae are normal.  Cardiovascular: Normal rate, regular rhythm and normal heart sounds.  Pulmonary/Chest: Effort normal and breath sounds normal.  Neurological: He is alert and oriented to person, place, and time. Gait normal. Gait normal.  Skin: Skin is warm, dry and intact.  Benign nevi to trunk  Psychiatric: Mood, memory, affect and judgment normal.  Nursing note and vitals reviewed.   Assessment   1. Vit D def  2. THC, Xanax UDS +, PHQ 9 score 4  3. CT ab/pelvis 5 mm RLL ground glass nodule 10/2016 CT ab/pelvis  4. HM Plan  1.  rec take 50K IU weekly if pt does not want to take this rec D3 5K IU qd  2. rec seek counseling pt reports  not interested in this time 3. CT chest w/o contrast  rec smoking cessation 4.  Declines flu  Tdap had 10/01/17  Hep B immune  Declines STD testing   Get records from [facility] Dr. Cherly Hensen and Lorn Junes in [place]  Pt also follows Dr. Achilles Dunk urology kidney stones  -saw Dr. Achilles Dunk 02/14/18 monitor stones, 1 year repeat KUB   Provider: Dr. French Ana McLean-Scocuzza-Internal Medicine

## 2018-01-08 NOTE — Patient Instructions (Addendum)
Vitamin D 50, 000 weekly x 6 months  If cant tolerate you could do 5000 D3 IU daily  Please let me know if you decide not to do CT chest  Take care   Vitamin D Deficiency Vitamin D deficiency is when your body does not have enough vitamin D. Vitamin D is important to your body for many reasons:  It helps the body to absorb two important minerals, called calcium and phosphorus.  It plays a role in bone health.  It may help to prevent some diseases, such as diabetes and multiple sclerosis.  It plays a role in muscle function, including heart function.  You can get vitamin D by:  Eating foods that naturally contain vitamin D.  Eating or drinking milk or other dairy products that have vitamin D added to them.  Taking a vitamin D supplement or a multivitamin supplement that contains vitamin D.  Being in the sun. Your body naturally makes vitamin D when your skin is exposed to sunlight. Your body changes the sunlight into a form of the vitamin that the body can use.  If vitamin D deficiency is severe, it can cause a condition in which your bones become soft. In adults, this condition is called osteomalacia. In children, this condition is called rickets. What are the causes? Vitamin D deficiency may be caused by:  Not eating enough foods that contain vitamin D.  Not getting enough sun exposure.  Having certain digestive system diseases that make it difficult for your body to absorb vitamin D. These diseases include Crohn disease, chronic pancreatitis, and cystic fibrosis.  Having a surgery in which a part of the stomach or a part of the small intestine is removed.  Being obese.  Having chronic kidney disease or liver disease.  What increases the risk? This condition is more likely to develop in:  Older people.  People who do not spend much time outdoors.  People who live in a long-term care facility.  People who have had broken bones.  People with weak or thin bones  (osteoporosis).  People who have a disease or condition that changes how the body absorbs vitamin D.  People who have dark skin.  People who take certain medicines, such as steroid medicines or certain seizure medicines.  People who are overweight or obese.  What are the signs or symptoms? In mild cases of vitamin D deficiency, there may not be any symptoms. If the condition is severe, symptoms may include:  Bone pain.  Muscle pain.  Falling often.  Broken bones caused by a minor injury.  How is this diagnosed? This condition is usually diagnosed with a blood test. How is this treated? Treatment for this condition may depend on what caused the condition. Treatment options include:  Taking vitamin D supplements.  Taking a calcium supplement. Your health care provider will suggest what dose is best for you.  Follow these instructions at home:  Take medicines and supplements only as told by your health care provider.  Eat foods that contain vitamin D. Choices include: ? Fortified dairy products, cereals, or juices. Fortified means that vitamin D has been added to the food. Check the label on the package to be sure. ? Fatty fish, such as salmon or trout. ? Eggs. ? Oysters.  Do not use a tanning bed.  Maintain a healthy weight. Lose weight, if needed.  Keep all follow-up visits as told by your health care provider. This is important. Contact a health care provider  if:  Your symptoms do not go away.  You feel like throwing up (nausea) or you throw up (vomit).  You have fewer bowel movements than usual or it is difficult for you to have a bowel movement (constipation). This information is not intended to replace advice given to you by your health care provider. Make sure you discuss any questions you have with your health care provider. Document Released: 12/18/2011 Document Revised: 03/08/2016 Document Reviewed: 02/10/2015 Elsevier Interactive Patient Education  2018  [employer]

## 2018-01-10 ENCOUNTER — Encounter (INDEPENDENT_AMBULATORY_CARE_PROVIDER_SITE_OTHER): Payer: Self-pay

## 2018-01-18 ENCOUNTER — Ambulatory Visit: Payer: 59

## 2018-02-14 ENCOUNTER — Ambulatory Visit: Payer: Self-pay | Admitting: *Deleted

## 2018-02-14 DIAGNOSIS — N200 Calculus of kidney: Secondary | ICD-10-CM | POA: Diagnosis not present

## 2018-02-14 DIAGNOSIS — Q6211 Congenital occlusion of ureteropelvic junction: Secondary | ICD-10-CM | POA: Diagnosis not present

## 2018-02-14 NOTE — Telephone Encounter (Signed)
Pt states he noticed a rash on his chest down to belt line and on bilateral arms down to wrist on Monday evening around 8:30-9 pm. Pt describes the rash as being red with tiny little bumps, as if he was poked by a sewing needle. Pt states he can not take benadryl because it makes him feel strange but he has been taking Zyrtec and using oatmeal baths. Pt states but it has helped some and the rash has not become worse.Pt states that on Monday for dinner he ate spicy cashew chicken and thinks it may have been something in the sauce. Pt states he does not have a nut allergy. Pt also states that on Sunday he ate shellfish and within 5 min of eating it he vomited.  Pt states he has not had a shell fish allergy in the past.No appt available with PCP or other providers in the office or at the [facility] location. Pt offered to make appt for Saturday clinic but the pt states he has to work. Offered to make appt with another provider at the [facility] offices but the pt states that is a little too far to drive. Advised pt that he could go to Urgent Care for treatment of symptoms and the pt states if it gets worse he will go to UC. Appt made for Tuesday with Cathlyn Parsons and pt also requesting to be put on wait list for tomorrow. Advised pt to call office back if fever occurs or he becomes worse. Pt verbalized understanding.   Reason for Disposition . [1] Pimples (localized) AND Daffy.Glade ] NO improvement after using Care Advice  Answer Assessment - Initial Assessment Questions 1. APPEARANCE of RASH: "Describe the rash."      Under skin little raised red small bumps 2. LOCATION: "Where is the rash located?"      3. NUMBER: "How many spots are there?"      All over chest and arms 4. SIZE: "How big are the spots?" (Inches, centimeters or compare to size of a coin)     Pricked like a sweing needle 5. ONSET: "When did the rash start?"      Monday evening 6. ITCHING: "Does the rash itch?" If so, ask: "How bad is  the itch?"  (Scale 1-10; or mild, moderate, severe)      7. PAIN: "Does the rash hurt?" If so, ask: "How bad is the pain?"  (Scale 1-10; or mild, moderate, severe)     4 8. OTHER SYMPTOMS: "Do you have any other symptoms?" (e.g., fever)     No  Protocols used: RASH OR REDNESS - LOCALIZED-A-AH

## 2018-02-15 ENCOUNTER — Encounter

## 2018-02-15 NOTE — Telephone Encounter (Signed)
FYI

## 2018-02-19 ENCOUNTER — Encounter: Payer: Self-pay | Admitting: Family Medicine

## 2018-02-19 ENCOUNTER — Ambulatory Visit: Payer: 59 | Admitting: Family Medicine

## 2018-02-19 VITALS — BP 114/80 | HR 73 | Temp 97.6°F | Resp 15 | Ht 69.0 in | Wt 134.4 lb

## 2018-02-19 DIAGNOSIS — R21 Rash and other nonspecific skin eruption: Secondary | ICD-10-CM

## 2018-02-19 NOTE — Patient Instructions (Signed)
Please use scent free/dye free products and monitor for any symptoms. If symptoms return, please keep a diary of products used and food consumed.   Advise avoidance at this time of shellfish and monitor for symptoms. If symptoms return, please follow up with your provider for further evaluation and treatment.    Rash A rash is a change in the color of the skin. A rash can also change the way your skin feels. There are many different conditions and factors that can cause a rash. Follow these instructions at home: Pay attention to any changes in your symptoms. Follow these instructions to help with your condition: Medicine Take or apply over-the-counter and prescription medicines only as told by your doctor. These may include:  Corticosteroid cream.  Anti-itch lotions.  Oral antihistamines.  Skin Care  Put cool compresses on the affected areas.  Try taking a bath with: ? Epsom salts. Follow the instructions on the packaging. You can get these at your local pharmacy or grocery store. ? Baking soda. Pour a small amount into the bath as told by your doctor. ? Colloidal oatmeal. Follow the instructions on the packaging. You can get this at your local pharmacy or grocery store.  Try putting baking soda paste onto your skin. Stir water into baking soda until it gets like a paste.  Do not scratch or rub your skin.  Avoid covering the rash. Make sure the rash is exposed to air as much as possible. General instructions  Avoid hot showers or baths, which can make itching worse. A cold shower may help.  Avoid scented soaps, detergents, and perfumes. Use gentle soaps, detergents, perfumes, and other cosmetic products.  Avoid anything that causes your rash. Keep a journal to help track what causes your rash. Write down: ? What you eat. ? What cosmetic products you use. ? What you drink. ? What you wear. This includes jewelry.  Keep all follow-up visits as told by your doctor. This is  important. Contact a doctor if:  You sweat at night.  You lose weight.  You pee (urinate) more than normal.  You feel weak.  You throw up (vomit).  Your skin or the whites of your eyes look yellow (jaundice).  Your skin: ? Tingles. ? Is numb.  Your rash: ? Does not go away after a few days. ? Gets worse.  You are: ? More thirsty than normal. ? More tired than normal.  You have: ? New symptoms. ? Pain in your belly (abdomen). ? A fever. ? Watery poop (diarrhea). Get help right away if:  Your rash covers all or most of your body. The rash may or may not be painful.  You have blisters that: ? Are on top of the rash. ? Grow larger. ? Grow together. ? Are painful. ? Are inside your nose or mouth.  You have a rash that: ? Looks like purple pinprick-sized spots all over your body. ? Has a "bull's eye" or looks like a target. ? Is red and painful, causes your skin to peel, and is not from being in the sun too long. This information is not intended to replace advice given to you by your health care provider. Make sure you discuss any questions you have with your health care provider. Document Released: 03/13/2008 Document Revised: 03/02/2016 Document Reviewed: 02/10/2015 Elsevier Interactive Patient Education  2018 [employer].

## 2018-02-19 NOTE — Progress Notes (Signed)
Patient ID: David [name], male   DOB: 01/12/1991, 27 y.o.   MRN: [id number]   PCP: McLean-Scocuzza, Pasty Spillers, MD  Subjective:  David [name] is a 27 y.o. year old very pleasant male patient who presents with a  Rash: Initial distribution: lower arms, then abdomen and back Prior history of this rash: No Discomfort associated: No Associated symptoms:  None, no pruritus Change in rash:  Improved. Did not move beyond arms, abdomen and back Denies:  Fever, chills, sweats, myalgias, difficulty swallowing, hoarseness, SOB, N/V, Abdominal pain, or tightening throat. No new exposures of soaps, lotions, laundry detergent, fabric softeners, foods, medications, herbal supplements, STDs, plants, animals, or insects. No history of insect bites such as tick bites also.  He reports eating shellfish while at the beach and stated that he vomited after eating this in the parking lot at the restaurant. He also reports using sunscreen for the "first time" in many years however cannot clearly state if symptoms of rash occurred after eating shellfish or use of this sunscreen.  -started: 6 days ago, symptoms have improved and resolved. He states that during the past 6 days, he experienced one morning without symptoms.  He was recently at the beach and noticed that when returning home his eyes also "puffed up" and he thought this might have been related to dog dander. He and his wife thoroughly cleaned their home, changed blankets, and he stopped using his "weighted blanket" which have improved symptoms.  -previous treatments: Oatmeal baths provided improvement.  -sick contacts/travel/risks: denies flu exposure. No recent sick contact exposure -Hx of: Anxiety  ROS-denies fever, SOB, NVD,   Pertinent Past Medical History- Anxiety; he reports that he is "always worried" about his skin  Medications- reviewed  No current outpatient medications on file.   No current facility-administered medications  for this visit.     Objective: BP 114/80 (BP Location: Left Arm, Patient Position: Sitting, Cuff Size: Normal)   Pulse 73   Temp 97.6 F (36.4 C) (Oral)   Resp 15   Ht  (1.753 m)   Wt 134 lb 6.4 oz (61 kg)   SpO2 99%   BMI 19.85 kg/m  Gen: NAD, resting comfortably HEENT: Oropharynx is clear and moist CV: RRR;  Lungs: CTAB no crackles, wheeze, rhonchi Abdomen: soft/nontender/nondistended/normal bowel sounds. No rebound or guarding.  Ext: no edema Skin: warm, dry; no evidence of rash noted; multiple tattoos present; benign nevi to trunk Neuro: grossly normal, moves all extremities  Assessment/Plan: 1. Rash and nonspecific skin eruption Resolved. History and exam today are suggestive of a rash that is likely allergic in nature however no specific trigger has been identified. No report of pruritus or visible rash makes identifying type of rash challenging today. Rash has resolved with conservative care using oatmeal baths and has not returned after changing blankets and avoiding further shellfish.   He reports anxiety related to any changes in his skin. He can also consider allergy testing and will let his PCP know if he chooses to explore this option. He will hold off on this today and monitor for any new symptoms as he feels better today. Finally, we reviewed reasons to return to care including if symptoms worsen or persist or new concerns arise- once again particularly rash that does not improve,  shortness of breath, N/V, or fever. Further advised patient to keep a diary of food choices and also use dye/scent free products. Also, advised avoidance of shellfish at this time. Reviewed importance  of calling 911immediately if symptoms of SOB, difficulty swallowing, or throat tightening.   David Catalina, FNP

## 2018-02-26 ENCOUNTER — Ambulatory Visit: Payer: 59

## 2018-03-05 ENCOUNTER — Telehealth: Payer: Self-pay | Admitting: Internal Medicine

## 2018-03-05 NOTE — Telephone Encounter (Signed)
Copied from CRM [id number]. Topic: General - Other >> Mar 05, 2018  4:04 PM [name], David wrote: Reason for CRM: Pt called in stating the rash came back and he feels he knows what may be causing it. Offered to schedule an appt for pt to come in but he requested to speak with a nurse. Cb# [phone]

## 2018-03-06 NOTE — Telephone Encounter (Signed)
Called pt regarding his recurrent rash; he states that if he sits in the sun too long he gets this rash; he also states that "it looks like sun burn but not really"; he has been using aveeno shower gel; he feels that maybe  He is allergic to sun screen because the 2 times that he applied it  He broke out; he wonders if he can apply triamcinolone cream to the area; will route request to office for review; the pt's best contact number is [phone]

## 2018-03-06 NOTE — Telephone Encounter (Signed)
FYi

## 2018-03-06 NOTE — Telephone Encounter (Signed)
Patient saw Amil Amen on 02-19-18 for an issue with the rash and suggested possible allergy testing. Please advise

## 2018-03-07 NOTE — Telephone Encounter (Signed)
He can apply TMC cream for itching or otc hydrocortisone  rec stop sunscreen if this is cause Im not sure  I like Neutrogena ultrasheer sunscreen  rec mild soaps I.e dove white or unscented bar  If does not resolve sch f/u    TMS

## 2018-03-08 NOTE — Telephone Encounter (Signed)
Left message to return call to office. [facility] nurse may advise patient.

## 2018-03-14 ENCOUNTER — Ambulatory Visit: Payer: 59 | Admitting: Family

## 2018-03-14 ENCOUNTER — Encounter: Payer: Self-pay | Admitting: Family

## 2018-03-14 VITALS — BP 108/70 | HR 97 | Temp 98.6°F | Resp 15 | Wt 129.5 lb

## 2018-03-14 DIAGNOSIS — R011 Cardiac murmur, unspecified: Secondary | ICD-10-CM | POA: Diagnosis not present

## 2018-03-14 DIAGNOSIS — J029 Acute pharyngitis, unspecified: Secondary | ICD-10-CM

## 2018-03-14 LAB — POCT RAPID STREP A (OFFICE): Rapid Strep A Screen: NEGATIVE

## 2018-03-14 MED ORDER — LIDOCAINE VISCOUS HCL 2 % MT SOLN: 15.0000 mL | OROMUCOSAL | 0 refills | Status: DC | PRN

## 2018-03-14 NOTE — Progress Notes (Signed)
Subjective:    Patient ID: David [name], male    DOB: 03/25/1991, 27 y.o.   MRN: [id number]  HPI  David [name] is a 27 yr old male who presents today with chief complaint of sore throat. Symptoms started yesterday.  Reports that he did not sleep well due to the pain. Hurts more on the right. Reports some ear congestion but no real nasal congestion. Reports mild cough. Has not tried any otc meds but water helps some with the cough.   Review of Systems     Past Medical History:  Diagnosis Date  . Anxiety   . Depression   . 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 Headache   . Insomnia   . Kidney papillary necrosis (HCC)   . Kidney stones   . Kidney stones    Dr. Achilles D[place]ope   . Strep throat   . Tick bite      Social History   Socioeconomic History  . Marital status: Married    Spouse name: Not on file  . Number of children: Not on file  . Years of education: Not on file  . Highest education level: Not on file  Occupational History  . Not on file  Social Needs  . Financial resource strain: Not on file  . Food insecurity:    Worry: Not on file    Inability: Not on file  . Transportation needs:    Medical: Not on file    Non-medical: Not on file  Tobacco Use  . Smoking status: Current Every Day Smoker    Packs/day: 0.50    Types: Cigarettes  . Smokeless tobacco: Never Used  Substance and Sexual Activity  . Alcohol use: Yes    Comment: socially  . Drug use: No  . Sexual activity: Yes  Lifestyle  . Physical activity:    Days per week: Not on file    Minutes per session: Not on file  . Stress: Not on file  Relationships  . Social connections:    Talks on phone: Not on file    Gets together: Not on file    Attends religious service: Not on file    Active member of club or organization: Not on file    Attends meetings of clubs or organizations: Not on file    Relationship status: Not on file  . Intimate  partner violence:    Fear of current or ex partner: Not on file    Emotionally abused: Not on file    Physically abused: Not on file    Forced sexual activity: Not on file  Other Topics Concern  . 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 HAND SURGERY Right    2012 right hand has metal plate   . 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 RECONSTRUCTION OF NOSE     2006     Family History  Problem Relation Age of Onset  . Cancer Mother        GU cancer   . Depression Mother   . Hypertension  Mother   . Depression Sister   . Kidney disease Sister   . Arthritis Maternal Grandmother   . Cancer Maternal Grandmother        GU cancer   . Alcohol abuse Maternal Grandfather   . Cancer Other        GU cancer     Allergies  Allergen Reactions  . Lunesta [Eszopiclone]     Current Outpatient Medications on File Prior to Visit  Medication Sig Dispense Refill  . cetirizine (ZYRTEC) 10 MG tablet Take 10 mg by mouth daily.     No current facility-administered medications on file prior to visit.     BP 108/70 (BP Location: Left Arm, Patient Position: Sitting, Cuff Size: Normal)   Pulse 97   Temp 98.6 F (37 C) (Oral)   Resp 15   Wt 129 lb 8 oz (58.7 kg)   SpO2 98%   BMI 19.12 kg/m    Objective:   Physical Exam  Constitutional: He is oriented to person, place, and time. He appears well-developed and well-nourished. No distress.  HENT:  Head: Normocephalic and atraumatic.  Right Ear: Tympanic membrane and ear canal normal.  Left Ear: Tympanic membrane and ear canal normal.  Mouth/Throat: Posterior oropharyngeal erythema present. No posterior oropharyngeal edema. Tonsils are 0 on the right. Tonsils are 0 on the left. No tonsillar exudate.    Cardiovascular: Normal rate and regular rhythm.  Murmur heard.  Systolic murmur is present with a grade of 3/6. Murmur is heard loudest from left posterior back  Pulmonary/Chest: Effort normal and breath sounds normal. No respiratory distress. He has no wheezes. He has no rales.  Musculoskeletal: He exhibits no edema.  Neurological: He is alert and oriented to person, place, and time.  Skin: Skin is warm and dry.  Psychiatric: He has a normal mood and affect. His behavior is normal. Thought content normal.          Assessment & Plan:  Viral pharyngitis- rapid strep negative- will send swab to lab to confirm. Can't take nsaids, therefore will rx with tylenol and viscous lidocaine prn pain. He is advised to call if new/worsening symptoms or if symptoms are not improved in 2-3 days.  Pt verbalizes understanding.  Murmur- states that PCP ordered echo but he has not scheduled due to cost. I encouraged pt to schedule as soon as he is able due to how loud his murmur is. States that he had an echo a few years back.

## 2018-03-14 NOTE — Patient Instructions (Signed)
For throat pain you may use lidocaine gargle and spit 4 times daily and tylenol 650mg  every 6 hours as needed.  Call if new/worsening pain or if symptoms are not improved in 2-3 days. Please schedule your echocardiogram (ultrasound of your heart) at your earliest convenience.

## 2018-03-15 ENCOUNTER — Telehealth: Payer: Self-pay | Admitting: Family

## 2018-03-15 ENCOUNTER — Ambulatory Visit: Payer: Self-pay

## 2018-03-15 MED ORDER — ACETAMINOPHEN-CODEINE 120-12 MG/5ML PO SOLN: 12.0000 mL | ORAL | 0 refills | Status: DC | PRN

## 2018-03-15 MED ORDER — CEFDINIR 250 MG/5ML PO SUSR: 300.0000 mg | Freq: Two times a day (BID) | ORAL | 0 refills | Status: DC

## 2018-03-15 NOTE — Telephone Encounter (Signed)
Please advise. See triage note

## 2018-03-15 NOTE — Addendum Note (Signed)
Addended by: Sandford Craze'SULLIVAN, Jemarion Roycroft on: 03/15/2018 04:55 PM   Modules accepted: Orders

## 2018-03-15 NOTE — Telephone Encounter (Signed)
Spoke to pt. States he is staying well hydrated but throat is sore R>L.  has tenderness under right ear along jaw line. Using tylenol. Feels more sore than yesterday.  Feels very tired.   Advised pt that I will send rx for abx and pain medication. He is advised to begin meds and go to the ER over the weekend if symptoms worsen or fail to improve.   He verbalizes understanding.  Requests not to have augmentin because it "made my urine pink and was too strong for my kidneys."

## 2018-03-15 NOTE — Telephone Encounter (Signed)
Phone call ret'd to pt.  Stated his throat is more painful, is having chills, and has soreness and tenderness on right neck.  Reported doesn't have a thermometer, but dressed in layers and under a blanket, and still chilled.  Reported difficulty swallowing the Tylenol, due to sore throat.  Reported that he has "doubled-up" on the Tylenol.  Used Lidocaine Viscous, but reported it gave him some anxiety because of the way it made his throat feel.  C/o muscle aching, headache, ears congested and watery, red eyes.  Stated he noticed red streaks on his tonsils.  Advised will contact the provider to receive further recommendations, since he was just in the office yesterday.  Called FC.  Advised of worsening complaints per patient.  Will send triage note to the office.    Made pt. aware that the office will call back with further recommendations from the MD.  Verb. Understanding.       Reason for Disposition . SEVERE (e.g., excruciating) throat pain    Pt. was assessed in the office 6/6.  Reported increased sore throat, sore gland on right neck, and chills.  Answer Assessment - Initial Assessment Questions 1. ONSET: "When did the throat start hurting?" (Hours or days ago)      Wednesday 2. SEVERITY: "How bad is the sore throat?" (Scale 1-10; mild, moderate or severe)   - MILD (1-3):  doesn't interfere with eating or normal activities   - MODERATE (4-7): interferes with eating some solids and normal activities   - SEVERE (8-10):  excruciating pain, interferes with most normal activities   - SEVERE DYSPHAGIA: can't swallow liquids, drooling     Moderate to severe; hard to swallow pills 3. STREP EXPOSURE: "Has there been any exposure to strep within the past week?" If so, ask: "What type of contact occurred?"      unknown 4.  VIRAL SYMPTOMS: "Are there any symptoms of a cold, such as a runny nose, cough, hoarse voice or red eyes?"      Feels like ears could be congested; head feels heavy, very tired,  muscle achy, eyes watery and red  5. FEVER: "Do you have a fever?" If so, ask: "What is your temperature, how was it measured, and when did it start?"     chills 6. PUS ON THE TONSILS: "Is there pus on the tonsils in the back of your throat?"     Red streaks on tonsils  7. OTHER SYMPTOMS: "Do you have any other symptoms?" (e.g., difficulty breathing, headache, rash)     headache  Protocols used: SORE THROAT-A-AH  Message from Rutherford Nail, NT sent at 03/15/2018 10:38 AM EDT   Summary: sore throat/drainage/possible fever   Patient requesting a call from nurse triage. States that he was seen yesterday 03/14/18 for a sore throat. Had no drainage and no fever at that time. States that last night he could not sleep. States that he had a lot of drainage then and was blowing his nose every 20 minutes. Would like a call to discuss what he can do. States he has tried tylenol and lidocane mouth rinse that he has been gargling. States that he can not breath out of his nose due to congestion, so he is breathing through his mouth which is making his sore throat worse due to his dryness. Please advise. CB#: [phone]

## 2018-03-16 LAB — CULTURE, GROUP A STREP
MICRO NUMBER:: 90681799
SPECIMEN QUALITY: ADEQUATE

## 2018-03-19 ENCOUNTER — Encounter: Payer: Self-pay | Admitting: Internal Medicine

## 2018-03-19 NOTE — Telephone Encounter (Signed)
Closing note due to patient not returning call. 

## 2018-03-19 NOTE — Telephone Encounter (Signed)
Opened in errof

## 2018-05-10 ENCOUNTER — Encounter: Payer: Self-pay | Admitting: *Deleted

## 2018-05-10 ENCOUNTER — Ambulatory Visit: Payer: 59 | Admitting: Internal Medicine

## 2018-05-10 VITALS — BP 115/78 | HR 66 | Temp 97.6°F | Resp 18 | Ht 69.0 in | Wt 128.1 lb

## 2018-05-10 DIAGNOSIS — E559 Vitamin D deficiency, unspecified: Secondary | ICD-10-CM

## 2018-05-10 DIAGNOSIS — L309 Dermatitis, unspecified: Secondary | ICD-10-CM | POA: Diagnosis not present

## 2018-05-10 DIAGNOSIS — J9801 Acute bronchospasm: Secondary | ICD-10-CM | POA: Diagnosis not present

## 2018-05-10 DIAGNOSIS — F419 Anxiety disorder, unspecified: Secondary | ICD-10-CM

## 2018-05-10 DIAGNOSIS — G4700 Insomnia, unspecified: Secondary | ICD-10-CM

## 2018-05-10 DIAGNOSIS — I341 Nonrheumatic mitral (valve) prolapse: Secondary | ICD-10-CM

## 2018-05-10 DIAGNOSIS — R911 Solitary pulmonary nodule: Secondary | ICD-10-CM

## 2018-05-10 MED ORDER — ALBUTEROL SULFATE HFA 108 (90 BASE) MCG/ACT IN AERS: 1.0000 | INHALATION_SPRAY | Freq: Four times a day (QID) | RESPIRATORY_TRACT | 11 refills | Status: DC | PRN

## 2018-05-10 MED ORDER — TRIAMCINOLONE ACETONIDE 0.1 % EX OINT: 1.0000 "application " | TOPICAL_OINTMENT | Freq: Two times a day (BID) | CUTANEOUS | 0 refills | Status: DC

## 2018-05-10 NOTE — Progress Notes (Signed)
Chief Complaint  Patient presents with  . Follow-up   F/u  1. Ground glass appearance on prior CT abdomen pending CT chest will try to get before 09/2018 pt reports wheezing still smoking but less no h/o asthma  2. Sore throat resolved and feeling better  3. Vit D def taking D3 2000 advised to inc 5000 IU qd  4. C/o rash to skin intermittently would like Rx for TMC oint  5. Anxiety due to school starting wants to take on classics ultimately work in [employer] wants therapy referral on MWF afternoon   Review of Systems  Constitutional: Negative for weight loss.  HENT: Negative for hearing loss.   Eyes: Negative for blurred vision.  Respiratory: Positive for wheezing. Negative for shortness of breath.   Cardiovascular: Negative for chest pain and palpitations.  Gastrointestinal: Negative for abdominal pain.  Musculoskeletal: Negative for falls.  Skin: Positive for rash.  Neurological: Negative for headaches.  Psychiatric/Behavioral: Negative for depression.   Past Medical History:  Diagnosis Date  . Anxiety   . Depression   . 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 Headache   . Insomnia   . Kidney papillary necrosis (HCC)   . Kidney stones   . Kidney stones    Dr. Achilles Dunkope   . Strep throat   . Tick bite    Past Surgical History:  Procedure Laterality Date  . HAND SURGERY Right    2012 right hand has metal plate   . 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 RECONSTRUCTION OF NOSE     2006    Family History  Problem Relation Age of Onset  . Cancer Mother        GU cancer   . Depression Mother   . Hypertension Mother   . Depression Sister   . Kidney disease Sister   . Arthritis Maternal Grandmother   . Cancer Maternal Grandmother        GU cancer   . Alcohol abuse Maternal Grandfather   . Cancer Other        GU cancer    Social History   Socioeconomic History  . Marital status:  Married    Spouse name: Not on file  . Number of children: Not on file  . Years of education: Not on file  . Highest education level: Not on file  Occupational History  . Not on file  Social Needs  . Financial resource strain: Not on file  . Food insecurity:    Worry: Not on file    Inability: Not on file  . Transportation needs:    Medical: Not on file    Non-medical: Not on file  Tobacco Use  . Smoking status: Current Every Day Smoker    Packs/day: 0.50    Types: Cigarettes  . Smokeless tobacco: Never Used  Substance and Sexual Activity  . Alcohol use: Yes    Comment: socially  . Drug use: No  . Sexual activity: Yes  Lifestyle  . Physical activity:    Days per week: Not on file    Minutes per session: Not on file  . Stress: Not on file  Relationships  . Social connections:    Talks on phone: Not on file    Gets together: Not on file    Attends religious service: Not on file  Active member of club or organization: Not on file    Attends meetings of clubs or organizations: Not on file    Relationship status: Not on file  . Intimate partner violence:    Fear of current or ex partner: Not on file    Emotionally abused: Not on file    Physically abused: Not on file    Forced sexual activity: Not on file  Other Topics Concern  . 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 cetirizine (ZYRTEC) 10 MG tablet Take 10 mg by mouth daily.   Allergies  Allergen Reactions  . Lunesta [Eszopiclone]    Recent Results (from the past 2160 hour(s))  POCT rapid strep A     Status: Normal   Collection Time: 03/14/18  1:47 PM  Result Value Ref Range   Rapid Strep A Screen Negative Negative  Culture, Group A Strep     Status: None   Collection Time: 03/14/18  2:31 PM  Result Value Ref Range   MICRO  NUMBER: [id number]    SPECIMEN QUALITY: ADEQUATE    SOURCE: THROAT    STATUS: FINAL    RESULT: No group A Streptococcus isolated    Objective  Body mass index is 18.92 kg/m. Wt Readings from Last 3 Encounters:  05/10/18 128 lb 2 oz (58.1 kg)  03/14/18 129 lb 8 oz (58.7 kg)  02/19/18 134 lb 6.4 oz (61 kg)   Temp Readings from Last 3 Encounters:  05/10/18 97.6 F (36.4 C) (Oral)  03/14/18 98.6 F (37 C) (Oral)  02/19/18 97.6 F (36.4 C) (Oral)   BP Readings from Last 3 Encounters:  05/10/18 115/78  03/14/18 108/70  02/19/18 114/80   Pulse Readings from Last 3 Encounters:  05/10/18 66  03/14/18 97  02/19/18 73    Physical Exam  Constitutional: He is oriented to person, place, and time. Vital signs are normal. He appears well-developed and well-nourished. He is cooperative.  HENT:  Head: Normocephalic and atraumatic.  Mouth/Throat: Oropharynx is clear and moist and mucous membranes are normal.  Eyes: Pupils are equal, round, and reactive to light. Conjunctivae are normal.  Cardiovascular: Normal rate and regular rhythm.  Murmur heard. Pulmonary/Chest: Effort normal and breath sounds normal.  Neurological: He is alert and oriented to person, place, and time. Gait normal.  Skin: Skin is warm, dry and intact.  Psychiatric: He has a normal mood and affect. His speech is normal and behavior is normal. Judgment and thought content normal. Cognition and memory are normal.  Nursing note and vitals reviewed.   Assessment   1. Ground glasslung nodule on CT ab 2. Vit D def  3. Dermatitis  4. Anxiety/insomnia 5. HM 6. MVP Plan   1. Pending CT chest will get by 09/2018  2. rec take otc D3 5000 iu qd  3. Prn tmc or hc 1-2.5 % 4. Refer to [facility] needs MWF appt lunch afternoon  5.  Declines flu  Tdap had 10/01/17   hep B immune Declines STD testing  rec smoking cessation  6.needs repeat echo by 03/30/2020 had 03/31/15 mild valve regurgitation mild MVP repeat echo 3-5 years  w/o sxs  Provider: Dr. French Ana McLean-Scocuzza-Internal Medicine

## 2018-05-10 NOTE — Patient Instructions (Addendum)
Please call to schedule Chest CT when available  F/u in 4-6 months sooner if needed  Take D3 5000 IU daily  You will need and echo by 03/30/2020   Bronchospasm, Adult Bronchospasm is a tightening of the airways going into the lungs. During an episode, it may be harder to breathe. You may cough, and you may make a whistling sound when you breathe (wheeze). This condition often affects people with asthma. What are the causes? This condition is caused by swelling and irritation in the airways. It can be triggered by:  An infection (common).  Seasonal allergies.  An allergic reaction.  Exercise.  Irritants. These include pollution, cigarette smoke, strong odors, aerosol sprays, and paint fumes.  Weather changes. Winds increase molds and pollens in the air. Cold air may cause swelling.  Stress and emotional upset.  What are the signs or symptoms? Symptoms of this condition include:  Wheezing. If the episode was triggered by an allergy, wheezing may start right away or hours later.  Nighttime coughing.  Frequent or severe coughing with a simple cold.  Chest tightness.  Shortness of breath.  Decreased ability to exercise.  How is this diagnosed? This condition is usually diagnosed with a review of your medical history and a physical exam. Tests, such as lung function tests, are sometimes done to look for other conditions. The need for a chest X-ray depends on where the wheezing occurs and whether it is the first time you have wheezed. How is this treated? This condition may be treated with:  Inhaled medicines. These open up the airways and help you breathe. They can be taken with an inhaler or a nebulizer device.  Corticosteroid medicines. These may be given for severe bronchospasm, usually when it is associated with asthma.  Avoiding triggers, such as irritants, infection, or allergies.  Follow these instructions at home: Medicines  Take over-the-counter and  prescription medicines only as told by your health care provider.  If you need to use an inhaler or nebulizer to take your medicine, ask your health care provider to explain how to use it correctly. If you were given a spacer, always use it with your inhaler. Lifestyle  Reduce the number of triggers in your home. To do this: ? Change your heating and air conditioning filter at least once a month. ? Limit your use of fireplaces and wood stoves. ? Do not smoke. Do not allow smoking in your home. ? Avoid using perfumes and fragrances. ? Get rid of pests, such as roaches and mice, and their droppings. ? Remove any mold from your home. ? Keep your house clean and dust free. Use unscented cleaning products. ? Replace carpet with wood, tile, or vinyl flooring. Carpet can trap dander and dust. ? Use allergy-proof pillows, mattress covers, and box spring covers. ? Wash bed sheets and blankets every week in hot water. Dry them in a dryer. ? Use blankets that are made of polyester or cotton. ? Wash your hands often. ? Do not allow pets in your bedroom.  Avoid breathing in cold air when you exercise. General instructions  Have a plan for seeking medical care. Know when to call your health care provider and local emergency services, and where to get emergency care.  Stay up to date on your immunizations.  When you have an episode of bronchospasm, stay calm. Try to relax and breathe more slowly.  If you have asthma, make sure you have an asthma action plan.  Keep all follow-up  visits as told by your health care provider. This is important. Contact a health care provider if:  You have muscle aches.  You have chest pain.  The mucus that you cough up (sputum) changes from clear or white to yellow, green, gray, or bloody.  You have a fever.  Your sputum gets thicker. Get help right away if:  Your wheezing and coughing get worse, even after you take your prescribed medicines.  It gets  even harder to breathe.  You develop severe chest pain. Summary  Bronchospasm is a tightening of the airways going into the lungs.  During an episode of bronchospasm, you may have a harder time breathing. You may cough and make a whistling sound when you breathe (wheeze).  Avoid exposure to triggers such as smoke, dust, mold, animal dander, and fragrances.  When you have an episode of bronchospasm, stay calm. Try to relax and breathe more slowly. This information is not intended to replace advice given to you by your health care provider. Make sure you discuss any questions you have with your health care provider. Document Released: 09/28/2003 Document Revised: 09/21/2016 Document Reviewed: 09/21/2016 Elsevier Interactive Patient Education  2017 [employer]   Mitral Valve Prolapse Mitral valve prolapse is a heart condition involving the mitral valve. This is the valve between the upper chamber (atrium) and the lower chamber (ventricle) on the left side of the heart. Normally, the mitral valve allows blood to flow from the atrium to the ventricle and then seals off the chambers from one another. If you have mitral valve prolapse, the valve does not work the way that it should. The flaps of the mitral valve do not form a tight seal between the atrium and the ventricle. When this happens, blood can flow the wrong way (mitral valve regurgitation). This causes symptoms of mitral valve prolapse. This condition can develop if:  The mitral valve flaps are larger and thicker than normal.  The valve opening stretches abnormally.  The valve flaps flop or bulge more than they should.  What are the causes? The cause of this condition is not known. However:  It is sometimes passed down (inherited) from a family member who also had the condition.  It can be a complication of other diseases.  What increases the risk? This condition is more like to develop in people with:  A family history of  mitral valve prolapse.  Muscular dystrophy.  Graves disease.  Scoliosis.  A connective tissue disorder.  What are the signs or symptoms? Symptoms of this condition include:  A fast or irregular heartbeat (palpitations).  Fatigue.  Dizziness.  Shortness of breath.  Discomfort in the chest area.  In some cases, there are no symptoms for this condition. How is this diagnosed? This condition may be diagnosed based on:  Your symptoms and medical history.  A physical exam, which includes listening to your heart with a stethoscope.  Other tests to confirm the diagnosis. These may include imaging studies of your heart, such as: ? X-rays. These check for fluid in the lungs. ? Echocardiogram. This test uses sound waves to show the size of your heart and how well it pumps. ? Doppler ultrasound. This test uses sound waves to take pictures of the blood flow through your valve. ? Electrocardiogram (ECG). This test records the electrical activity of your heart.  How is this treated? Treatment for this condition depends on how severe your symptoms are. If you need treatment, the goal is to relieve  symptoms and prevent further problems, such as a type of heart infection called endocarditis. Treatment can include:  Medicines. You may need to take: ? Beta blockers. These help with chest discomfort and palpitations. ? Vasodilators. These improve forward blood flow through the valve, if it is leaking. ? Water pills (diuretics). These get rid of any extra fluid that is present.  Surgery to repair or replace the mitral valve.  If you do not have symptoms, you may not need treatment. Follow these instructions at home:  Take over-the-counter and prescription medicines only as told by your health care provider.  Get regular exercise. Ask your health care provider to recommend some activities that are safe for you to do.  Do not use any products that contain nicotine or tobacco, such as  cigarettes and e-cigarettes. If you need help quitting, ask your health care provider.  Avoid being around secondhand smoke.  Brush and floss your teeth every day. See a dentist regularly. Having unhealthy teeth and gums may make endocarditis more likely.  Keep all follow-up visits as told by your health care provider. This is important. Contact a health care provider if:  You have any kind of abnormal heartbeat.  You are often very tired.  You have a cough that will not go away.  Your symptoms of mitral valve prolapse begin to get worse. Get help right away if:  You have chest pain or shortness of breath.  You start to have chills, body aches, and a fever. This information is not intended to replace advice given to you by your health care provider. Make sure you discuss any questions you have with your health care provider. Document Released: 09/22/2000 Document Revised: 05/23/2016 Document Reviewed: 03/18/2016 Elsevier Interactive Patient Education  [employer].

## 2018-07-18 ENCOUNTER — Ambulatory Visit: Payer: Self-pay | Admitting: Internal Medicine

## 2018-07-25 ENCOUNTER — Encounter: Payer: Self-pay | Admitting: Internal Medicine

## 2018-07-25 ENCOUNTER — Ambulatory Visit: Payer: 59 | Admitting: Internal Medicine

## 2018-07-25 VITALS — BP 98/70 | HR 61 | Temp 98.4°F | Ht 69.0 in | Wt 132.0 lb

## 2018-07-25 DIAGNOSIS — R21 Rash and other nonspecific skin eruption: Secondary | ICD-10-CM

## 2018-07-25 DIAGNOSIS — T7840XA Allergy, unspecified, initial encounter: Secondary | ICD-10-CM | POA: Insufficient documentation

## 2018-07-25 DIAGNOSIS — J342 Deviated nasal septum: Secondary | ICD-10-CM | POA: Insufficient documentation

## 2018-07-25 DIAGNOSIS — T7840XD Allergy, unspecified, subsequent encounter: Secondary | ICD-10-CM

## 2018-07-25 DIAGNOSIS — I341 Nonrheumatic mitral (valve) prolapse: Secondary | ICD-10-CM | POA: Diagnosis not present

## 2018-07-25 MED ORDER — LEVOCETIRIZINE DIHYDROCHLORIDE 5 MG PO TABS
5.0000 mg | ORAL_TABLET | Freq: Every evening | ORAL | 3 refills | Status: DC
Start: 2018-07-25 — End: 2018-09-24

## 2018-07-25 NOTE — Progress Notes (Signed)
Chief Complaint  Patient presents with  . skin allerginess    Rashes are located on arms and chest. Rashes started back in May comes and go. Unsuer what might be the cause. Rashes are little oin dot rashes   . recurring headaches    started since he started back school in August   . sinus congestion    started 2-3 weeks ago pt is c/o sneezing, sinus congestion, runny nose. No fever.    F/u  1. C/o allergies and sneezing he is wondering if he is allergic to dogs never had this allergy before zyrtec is not working for him and wife wanted him to try Xyzal. Also  2. C/o increased freq of h/a since started school. H/a are frontal w/o radiation and associated with nausea w/o vomiting, photophobia. He reports Relpax in the past did not help. He is getting his eyes checked 10/2018.tylenol and a hot shower which normally help h/a have not worked he has even tried THC. He is sleeping 4 hours at night due to working and going to school. He is not sure if h/a related to stress but he does have h/o migraines.  3.c/o rash to arms, chest that is not itchy it looks red like someone stuck him with pen needles did not use any new products but this rash has been present since sand flea bites months ago he also reports he walks into a lot of spider webs and has been to [facility] so not sure what he has been exposed too.  4. C/o deviated nasal septum and wants referral to ENT [facility]. H/o trauma x 3 s/p surgery x 1 x in 3rd grade. Disc also allergy testing blood testing per ENT if indicated he does not want to see an allergist and be injected with an allergen    Review of Systems  Constitutional: Negative for weight loss.  HENT: Negative for hearing loss and sinus pain.   Eyes: Negative for blurred vision.  Respiratory: Negative for shortness of breath.   Cardiovascular:       Cardiac mumur    Gastrointestinal: Negative for abdominal pain.  Skin: Positive for rash. Negative for itching.  Neurological: Negative for  headaches.  Psychiatric/Behavioral: Negative for depression.   Past Medical History:  Diagnosis Date  . Anxiety   . Depression   . 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 Headache   . Insomnia   . Kidney papillary necrosis (HCC)   . Kidney stones   . Kidney stones    Dr. Achilles Dunk   . Strep throat   . Tick bite    Past Surgical History:  Procedure Laterality Date  . HAND SURGERY Right    2012 right hand has metal plate   . 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 RECONSTRUCTION OF NOSE     2006    Family History  Problem Relation Age of Onset  . Cancer Mother        GU cancer   . Depression Mother   . Hypertension Mother   . Depression Sister   . Kidney disease Sister   . Arthritis Maternal Grandmother   . Cancer Maternal Grandmother        GU cancer   . Alcohol abuse Maternal Grandfather   . Cancer Other        GU  cancer    Social History   Socioeconomic History  . Marital status: Married    Spouse name: Not on file  . Number of children: Not on file  . Years of education: Not on file  . Highest education level: Not on file  Occupational History  . Not on file  Social Needs  . Financial resource strain: Not on file  . Food insecurity:    Worry: Not on file    Inability: Not on file  . Transportation needs:    Medical: Not on file    Non-medical: Not on file  Tobacco Use  . Smoking status: Current Every Day Smoker    Packs/day: 0.50    Types: Cigarettes  . Smokeless tobacco: Never Used  Substance and Sexual Activity  . Alcohol use: Yes    Comment: socially  . Drug use: No  . Sexual activity: Yes  Lifestyle  . Physical activity:    Days per week: Not on file    Minutes per session: Not on file  . Stress: Not on file  Relationships  . Social connections:    Talks on phone: Not on file    Gets together: Not on file    Attends religious service: Not on file     Active member of club or organization: Not on file    Attends meetings of clubs or organizations: Not on file    Relationship status: Not on file  . Intimate partner violence:    Fear of current or ex partner: Not on file    Emotionally abused: Not on file    Physically abused: Not on file    Forced sexual activity: Not on file  Other Topics Concern  . 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   Allergies  Allergen Reactions  . Lunesta [Eszopiclone]    No results found for this or any previous visit (from the past 2160 hour(s)). Objective  Body mass index is 19.49 kg/m. Wt Readings from Last 3 Encounters:  07/25/18 132 lb (59.9 kg)  05/10/18 128 lb 2 oz (58.1 kg)  03/14/18 129 lb 8 oz (58.7 kg)   Temp Readings from Last 3 Encounters:  07/25/18 98.4 F (36.9 C) (Oral)  05/10/18 97.6 F (36.4 C) (Oral)  03/14/18 98.6 F (37 C) (Oral)   BP Readings from Last 3 Encounters:  07/25/18 98/70  05/10/18 115/78  03/14/18 108/70   Pulse Readings from Last 3 Encounters:  07/25/18 61  05/10/18 66  03/14/18 97    Physical Exam  Constitutional: He is oriented to person, place, and time. Vital signs are normal. He appears well-developed and well-nourished. He is cooperative.  HENT:  Head: Normocephalic and atraumatic.  Mouth/Throat: Oropharynx is clear and moist and mucous membranes are normal.  Eyes: Pupils are equal, round, and reactive to light. Conjunctivae are normal.  Cardiovascular: Normal rate and regular rhythm.  Murmur heard. Pulmonary/Chest: Effort normal and breath sounds normal.  Neurological: He is alert and oriented to person, place, and time. Gait normal.  Skin: Skin is warm, dry and intact.   Psychiatric: He has a normal mood and affect. His speech is normal  and behavior is normal. Judgment and thought content normal. Cognition and memory are normal.  Nursing note and vitals reviewed.   Assessment   1. Allergic rhinitis and deviated septum  2. Likely tension h/a vs migraines  3. Rash ? Etiology  4. HM Plan   1. Refer to [facility] ENT consider allergy blood testing and tx for deviated nasal septum  D/c zyrtec and change to xyzal 2.  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 Monitor  No rash today consider dermatology in future  4.  Declines flu  Tdap had 10/01/17  hep B immune Declines STD testing  rec smoking cessation  -needs repeat echo by 03/30/2020 had 03/31/15 mild valve regurgitation mild MVP repeat echo 3-5 years w/o sxs -still consider CT chest ground glass nodule noted on prior imaging  Provider: Dr. French Ana McLean-Scocuzza-Internal Medicine

## 2018-07-25 NOTE — Patient Instructions (Addendum)
Tylenol and caffeine excedrine  Increase sleep please 6-8 hours  Magnesium 250-400 mg daily  accupuncture may help  Consider propranolol for h/a prevention  Consider singulair if allergies getting worse   Fiorocet/Acetaminophen; Butalbital; Caffeine tablets or capsules What is this medicine? ACETAMINOPHEN; BUTALBITAL; CAFFEINE (a set a MEE noe fen; byoo TAL bi tal; KAF een) is a pain reliever. It is used to treat tension headaches. This medicine may be used for other purposes; ask your health care provider or pharmacist if you have questions. COMMON BRAND NAME(S): Alagesic, Americet, Anolor-300, Arcet, BAC, CAPACET, Dolgic Plus, Esgic, Esgic Plus, Ezol, Fioricet, Ryder System, Medigesic, [place], [address] [place], Phrenilin Forte, Repan, [place], Triad, Zebutal What should I tell my health care provider before I take this medicine? They need to know if you have any of these conditions: -drug abuse or addiction -heart or circulation problems -if you often drink alcohol -kidney disease or problems going to the bathroom -liver disease -lung disease, asthma, or breathing problems -porphyria -an unusual or allergic reaction to acetaminophen, butalbital or other barbiturates, caffeine, other medicines, foods, dyes, or preservatives -pregnant or trying to get pregnant -breast-feeding How should I use this medicine? Take this medicine by mouth with a full glass of water. Follow the directions on the prescription label. If the medicine upsets your stomach, take the medicine with food or milk. Do not take more than you are told to take. Talk to your pediatrician regarding the use of this medicine in children. Special care may be needed. Overdosage: If you think you have taken too much of this medicine contact a poison control center or emergency room at once. NOTE: This medicine is only for you. Do not share this medicine with others. What if I miss a dose? If you miss a dose, take it as soon as you can.  If it is almost time for your next dose, take only that dose. Do not take double or extra doses. What may interact with this medicine? -alcohol or medicines that contain alcohol -antidepressants, especially MAOIs like isocarboxazid, phenelzine, tranylcypromine, and selegiline -antihistamines -benzodiazepines -carbamazepine -isoniazid -medicines for pain like pentazocine, buprenorphine, butorphanol, nalbuphine, tramadol, and propoxyphene -muscle relaxants -naltrexone -phenobarbital, phenytoin, and fosphenytoin -phenothiazines like perphenazine, thioridazine, chlorpromazine, mesoridazine, fluphenazine, prochlorperazine, promazine, and trifluoperazine -voriconazole This list may not describe all possible interactions. Give your health care provider a list of all the medicines, herbs, non-prescription drugs, or dietary supplements you use. Also tell them if you smoke, drink alcohol, or use illegal drugs. Some items may interact with your medicine. What should I watch for while using this medicine? Tell your doctor or health care professional if your pain does not go away, if it gets worse, or if you have new or a different type of pain. You may develop tolerance to the medicine. Tolerance means that you will need a higher dose of the medicine for pain relief. Tolerance is normal and is expected if you take the medicine for a long time. Do not suddenly stop taking your medicine because you may develop a severe reaction. Your body becomes used to the medicine. This does NOT mean you are addicted. Addiction is a behavior related to getting and using a drug for a non-medical reason. If you have pain, you have a medical reason to take pain medicine. Your doctor will tell you how much medicine to take. If your doctor wants you to stop the medicine, the dose will be slowly lowered over time to avoid any side effects. You may  get drowsy or dizzy when you first start taking the medicine or change doses. Do not  drive, use machinery, or do anything that may be dangerous until you know how the medicine affects you. Stand or sit up slowly. Do not take other medicines that contain acetaminophen with this medicine. Always read labels carefully. If you have questions, ask your doctor or pharmacist. If you take too much acetaminophen get medical help right away. Too much acetaminophen can be very dangerous and cause liver damage. Even if you do not have symptoms, it is important to get help right away. What side effects may I notice from receiving this medicine? Side effects that you should report to your doctor or health care professional as soon as possible: -allergic reactions like skin rash, itching or hives, swelling of the face, lips, or tongue -breathing problems -confusion -feeling faint or lightheaded, falls -redness, blistering, peeling or loosening of the skin, including inside the mouth -seizure -stomach pain -yellowing of the eyes or skin Side effects that usually do not require medical attention (report to your doctor or health care professional if they continue or are bothersome): -constipation -nausea, vomiting This list may not describe all possible side effects. Call your doctor for medical advice about side effects. You may report side effects to FDA at 1-800-FDA-1088. Where should I keep my medicine? Keep out of the reach of children. This medicine can be abused. Keep your medicine in a safe place to protect it from theft. Do not share this medicine with anyone. Selling or giving away this medicine is dangerous and against the law. This medicine may cause accidental overdose and death if it taken by other adults, children, or pets. Mix any unused medicine with a substance like cat litter or coffee grounds. Then throw the medicine away in a sealed container like a sealed bag or a coffee can with a lid. Do not use the medicine after the expiration date. Store at room temperature between 15  and 30 degrees C (59 and 86 degrees F). NOTE: This sheet is a summary. It may not cover all possible information. If you have questions about this medicine, talk to your doctor, pharmacist, or health care provider.  2018 Elsevier/Gold Standard (2013-11-21 15:00:25)   Propranolol extended-release capsules What is this medicine? PROPRANOLOL (proe PRAN oh lole) is a beta-blocker. Beta-blockers reduce the workload on the heart and help it to beat more regularly. This medicine is used to treat high blood pressure, heart muscle disease, and prevent chest pain caused by angina. It is also used to prevent migraine headaches. You should not use this medicine to treat a migraine that has already started. This medicine may be used for other purposes; ask your health care provider or pharmacist if you have questions. COMMON BRAND NAME(S): Inderal LA, Inderal XL, InnoPran XL What should I tell my health care provider before I take this medicine? They need to know if you have any of these conditions: -circulation problems, or blood vessel disease -diabetes -history of heart attack or heart disease, vasospastic angina -kidney disease -liver disease -lung or breathing disease, like asthma or emphysema -pheochromocytoma -slow heart rate -thyroid disease -an unusual or allergic reaction to propranolol, other beta-blockers, medicines, foods, dyes, or preservatives -pregnant or trying to get pregnant -breast-feeding How should I use this medicine? Take this medicine by mouth with a glass of water. Follow the directions on the prescription label. Do not crush or chew. Take your doses at regular intervals. Do not take  your medicine more often than directed. Do not stop taking except on the advice of your doctor or health care professional. Talk to your pediatrician regarding the use of this medicine in children. Special care may be needed. Overdosage: If you think you have taken too much of this medicine  contact a poison control center or emergency room at once. NOTE: This medicine is only for you. Do not share this medicine with others. What if I miss a dose? If you miss a dose, take it as soon as you can. If it is almost time for your next dose, take only that dose. Do not take double or extra doses. What may interact with this medicine? Do not take this medicine with any of the following medications: -feverfew -phenothiazines like chlorpromazine, mesoridazine, prochlorperazine, thioridazine This medicine may also interact with the following medications: -aluminum hydroxide gel -antipyrine -antiviral medicines for HIV or AIDS -barbiturates like phenobarbital -certain medicines for blood pressure, heart disease, irregular heart beat -cimetidine -ciprofloxacin -diazepam -fluconazole -haloperidol -isoniazid -medicines for cholesterol like cholestyramine or colestipol -medicines for mental depression -medicines for migraine headache like almotriptan, eletriptan, frovatriptan, naratriptan, rizatriptan, sumatriptan, zolmitriptan -NSAIDs, medicines for pain and inflammation, like ibuprofen or naproxen -phenytoin -rifampin -teniposide -theophylline -thyroid medicines -tolbutamide -warfarin -zileuton This list may not describe all possible interactions. Give your health care provider a list of all the medicines, herbs, non-prescription drugs, or dietary supplements you use. Also tell them if you smoke, drink alcohol, or use illegal drugs. Some items may interact with your medicine. What should I watch for while using this medicine? Visit your doctor or health care professional for regular check ups. Contact your doctor right away if your symptoms worsen. Check your blood pressure and pulse rate regularly. Ask your health care professional what your blood pressure and pulse rate should be, and when you should contact them. Do not stop taking this medicine suddenly. This could lead to  serious heart-related effects. You may get drowsy or dizzy. Do not drive, use machinery, or do anything that needs mental alertness until you know how this drug affects you. Do not stand or sit up quickly, especially if you are an older patient. This reduces the risk of dizzy or fainting spells. Alcohol can make you more drowsy and dizzy. Avoid alcoholic drinks. This medicine can affect blood sugar levels. If you have diabetes, check with your doctor or health care professional before you change your diet or the dose of your diabetic medicine. Do not treat yourself for coughs, colds, or pain while you are taking this medicine without asking your doctor or health care professional for advice. Some ingredients may increase your blood pressure. What side effects may I notice from receiving this medicine? Side effects that you should report to your doctor or health care professional as soon as possible: -allergic reactions like skin rash, itching or hives, swelling of the face, lips, or tongue -breathing problems -changes in blood sugar -cold hands or feet -difficulty sleeping, nightmares -dry peeling skin -hallucinations -muscle cramps or weakness -slow heart rate -swelling of the legs and ankles -vomiting Side effects that usually do not require medical attention (report to your doctor or health care professional if they continue or are bothersome): -change in sex drive or performance -diarrhea -dry sore eyes -hair loss -nausea -weak or tired This list may not describe all possible side effects. Call your doctor for medical advice about side effects. You may report side effects to FDA at 1-800-FDA-1088. Where should I  keep my medicine? Keep out of the reach of children. Store at room temperature between 15 and 30 degrees C (59 and 86 degrees F). Protect from light, moisture and freezing. Keep container tightly closed. Throw away any unused medicine after the expiration date. NOTE: This  sheet is a summary. It may not cover all possible information. If you have questions about this medicine, talk to your doctor, pharmacist, or health care provider.  2018 Elsevier/Gold Standard (2013-05-30 14:58:56)   Migraine Headache A migraine headache is an intense, throbbing pain on one side or both sides of the head. Migraines may also cause other symptoms, such as nausea, vomiting, and sensitivity to light and noise. What are the causes? Doing or taking certain things may also trigger migraines, such as:  Alcohol.  Smoking.  Medicines, such as: ? Medicine used to treat chest pain (nitroglycerine). ? Birth control pills. ? Estrogen pills. ? Certain blood pressure medicines.  Aged cheeses, chocolate, or caffeine.  Foods or drinks that contain nitrates, glutamate, aspartame, or tyramine.  Physical activity.  Other things that may trigger a migraine include:  Menstruation.  Pregnancy.  Hunger.  Stress, lack of sleep, too much sleep, or fatigue.  Weather changes.  What increases the risk? The following factors may make you more likely to experience migraine headaches:  Age. Risk increases with age.  Family history of migraine headaches.  Being Caucasian.  Depression and anxiety.  Obesity.  Being a woman.  Having a hole in the heart (patent foramen ovale) or other heart problems.  What are the signs or symptoms? The main symptom of this condition is pulsating or throbbing pain. Pain may:  Happen in any area of the head, such as on one side or both sides.  Interfere with daily activities.  Get worse with physical activity.  Get worse with exposure to bright lights or loud noises.  Other symptoms may include:  Nausea.  Vomiting.  Dizziness.  General sensitivity to bright lights, loud noises, or smells.  Before you get a migraine, you may get warning signs that a migraine is developing (aura). An aura may include:  Seeing flashing lights or  having blind spots.  Seeing bright spots, halos, or zigzag lines.  Having tunnel vision or blurred vision.  Having numbness or a tingling feeling.  Having trouble talking.  Having muscle weakness.  How is this diagnosed? A migraine headache can be diagnosed based on:  Your symptoms.  A physical exam.  Tests, such as CT scan or MRI of the head. These imaging tests can help rule out other causes of headaches.  Taking fluid from the spine (lumbar puncture) and analyzing it (cerebrospinal fluid analysis, or CSF analysis).  How is this treated? A migraine headache is usually treated with medicines that:  Relieve pain.  Relieve nausea.  Prevent migraines from coming back.  Treatment may also include:  Acupuncture.  Lifestyle changes like avoiding foods that trigger migraines.  Follow these instructions at home: Medicines  Take over-the-counter and prescription medicines only as told by your health care provider.  Do not drive or use heavy machinery while taking prescription pain medicine.  To prevent or treat constipation while you are taking prescription pain medicine, your health care provider may recommend that you: ? Drink enough fluid to keep your urine clear or pale yellow. ? Take over-the-counter or prescription medicines. ? Eat foods that are high in fiber, such as fresh fruits and vegetables, whole grains, and beans. ? Limit foods that are  high in fat and processed sugars, such as fried and sweet foods. Lifestyle  Avoid alcohol use.  Do not use any products that contain nicotine or tobacco, such as cigarettes and e-cigarettes. If you need help quitting, ask your health care provider.  Get at least 8 hours of sleep every night.  Limit your stress. General instructions   Keep a journal to find out what may trigger your migraine headaches. For example, write down: ? What you eat and drink. ? How much sleep you get. ? Any change to your diet or  medicines.  If you have a migraine: ? Avoid things that make your symptoms worse, such as bright lights. ? It may help to lie down in a dark, quiet room. ? Do not drive or use heavy machinery. ? Ask your health care provider what activities are safe for you while you are experiencing symptoms.  Keep all follow-up visits as told by your health care provider. This is important. Contact a health care provider if:  You develop symptoms that are different or more severe than your usual migraine symptoms. Get help right away if:  Your migraine becomes severe.  You have a fever.  You have a stiff neck.  You have vision loss.  Your muscles feel weak or like you cannot control them.  You start to lose your balance often.  You develop trouble walking.  You faint. This information is not intended to replace advice given to you by your health care provider. Make sure you discuss any questions you have with your health care provider. Document Released: 09/25/2005 Document Revised: 04/14/2016 Document Reviewed: 03/13/2016 Elsevier Interactive Patient Education  2017 [employer].

## 2018-09-20 ENCOUNTER — Telehealth: Payer: Self-pay | Admitting: Internal Medicine

## 2018-09-20 NOTE — Telephone Encounter (Signed)
Pt would like to have a copy of his Tdap test done on 10/01/2017. Pt will be in on 12/17 for appt. Please and Thank you!

## 2018-09-24 ENCOUNTER — Ambulatory Visit: Payer: 59 | Admitting: Internal Medicine

## 2018-09-24 ENCOUNTER — Encounter: Payer: Self-pay | Admitting: Internal Medicine

## 2018-09-24 VITALS — BP 106/68 | HR 59 | Temp 97.8°F | Ht 69.0 in | Wt 132.8 lb

## 2018-09-24 DIAGNOSIS — R51 Headache: Secondary | ICD-10-CM

## 2018-09-24 DIAGNOSIS — R9389 Abnormal findings on diagnostic imaging of other specified body structures: Secondary | ICD-10-CM | POA: Diagnosis not present

## 2018-09-24 DIAGNOSIS — Z0000 Encounter for general adult medical examination without abnormal findings: Secondary | ICD-10-CM

## 2018-09-24 DIAGNOSIS — Z1329 Encounter for screening for other suspected endocrine disorder: Secondary | ICD-10-CM

## 2018-09-24 DIAGNOSIS — E559 Vitamin D deficiency, unspecified: Secondary | ICD-10-CM

## 2018-09-24 DIAGNOSIS — K08409 Partial loss of teeth, unspecified cause, unspecified class: Secondary | ICD-10-CM

## 2018-09-24 DIAGNOSIS — Z1159 Encounter for screening for other viral diseases: Secondary | ICD-10-CM

## 2018-09-24 DIAGNOSIS — Z13220 Encounter for screening for lipoid disorders: Secondary | ICD-10-CM

## 2018-09-24 DIAGNOSIS — R519 Headache, unspecified: Secondary | ICD-10-CM

## 2018-09-24 DIAGNOSIS — Z0184 Encounter for antibody response examination: Secondary | ICD-10-CM

## 2018-09-24 DIAGNOSIS — Z1389 Encounter for screening for other disorder: Secondary | ICD-10-CM

## 2018-09-24 NOTE — Patient Instructions (Addendum)
Try Magnesium 250-400 mg daily  Vitamin B2 200 2x per day  Excedrin tension headache or migraine otc  Tylenol 500 mg up to 6 pills in 1 day is safe   Let me know about CT chest    General Headache Without Cause A headache is pain or discomfort felt around the head or neck area. The specific cause of a headache may not be found. There are many causes and types of headaches. A few common ones are:  Tension headaches.  Migraine headaches.  Cluster headaches.  Chronic daily headaches.  Follow these instructions at home: Watch your condition for any changes. Take these steps to help with your condition: Managing pain  Take over-the-counter and prescription medicines only as told by your health care provider.  Lie down in a dark, quiet room when you have a headache.  If directed, apply ice to the head and neck area: ? Put ice in a plastic bag. ? Place a towel between your skin and the bag. ? Leave the ice on for 20 minutes, 2-3 times per day.  Use a heating pad or hot shower to apply heat to the head and neck area as told by your health care provider.  Keep lights dim if bright lights bother you or make your headaches worse. Eating and drinking  Eat meals on a regular schedule.  Limit alcohol use.  Decrease the amount of caffeine you drink, or stop drinking caffeine. General instructions  Keep all follow-up visits as told by your health care provider. This is important.  Keep a headache journal to help find out what may trigger your headaches. For example, write down: ? What you eat and drink. ? How much sleep you get. ? Any change to your diet or medicines.  Try massage or other relaxation techniques.  Limit stress.  Sit up straight, and do not tense your muscles.  Do not use tobacco products, including cigarettes, chewing tobacco, or e-cigarettes. If you need help quitting, ask your health care provider.  Exercise regularly as told by your health care  provider.  Sleep on a regular schedule. Get 7-9 hours of sleep, or the amount recommended by your health care provider. Contact a health care provider if:  Your symptoms are not helped by medicine.  You have a headache that is different from the usual headache.  You have nausea or you vomit.  You have a fever. Get help right away if:  Your headache becomes severe.  You have repeated vomiting.  You have a stiff neck.  You have a loss of vision.  You have problems with speech.  You have pain in the eye or ear.  You have muscular weakness or loss of muscle control.  You lose your balance or have trouble walking.  You feel faint or pass out.  You have confusion. This information is not intended to replace advice given to you by your health care provider. Make sure you discuss any questions you have with your health care provider. Document Released: 09/25/2005 Document Revised: 03/02/2016 Document Reviewed: 01/18/2015 Elsevier Interactive Patient Education  2018 [employer].  Migraine Headache A migraine headache is an intense, throbbing pain on one side or both sides of the head. Migraines may also cause other symptoms, such as nausea, vomiting, and sensitivity to light and noise. What are the causes? Doing or taking certain things may also trigger migraines, such as:  Alcohol.  Smoking.  Medicines, such as: ? Medicine used to treat chest pain (  nitroglycerine). ? Birth control pills. ? Estrogen pills. ? Certain blood pressure medicines.  Aged cheeses, chocolate, or caffeine.  Foods or drinks that contain nitrates, glutamate, aspartame, or tyramine.  Physical activity.  Other things that may trigger a migraine include:  Menstruation.  Pregnancy.  Hunger.  Stress, lack of sleep, too much sleep, or fatigue.  Weather changes.  What increases the risk? The following factors may make you more likely to experience migraine headaches:  Age. Risk  increases with age.  Family history of migraine headaches.  Being Caucasian.  Depression and anxiety.  Obesity.  Being a woman.  Having a hole in the heart (patent foramen ovale) or other heart problems.  What are the signs or symptoms? The main symptom of this condition is pulsating or throbbing pain. Pain may:  Happen in any area of the head, such as on one side or both sides.  Interfere with daily activities.  Get worse with physical activity.  Get worse with exposure to bright lights or loud noises.  Other symptoms may include:  Nausea.  Vomiting.  Dizziness.  General sensitivity to bright lights, loud noises, or smells.  Before you get a migraine, you may get warning signs that a migraine is developing (aura). An aura may include:  Seeing flashing lights or having blind spots.  Seeing bright spots, halos, or zigzag lines.  Having tunnel vision or blurred vision.  Having numbness or a tingling feeling.  Having trouble talking.  Having muscle weakness.  How is this diagnosed? A migraine headache can be diagnosed based on:  Your symptoms.  A physical exam.  Tests, such as CT scan or MRI of the head. These imaging tests can help rule out other causes of headaches.  Taking fluid from the spine (lumbar puncture) and analyzing it (cerebrospinal fluid analysis, or CSF analysis).  How is this treated? A migraine headache is usually treated with medicines that:  Relieve pain.  Relieve nausea.  Prevent migraines from coming back.  Treatment may also include:  Acupuncture.  Lifestyle changes like avoiding foods that trigger migraines.  Follow these instructions at home: Medicines  Take over-the-counter and prescription medicines only as told by your health care provider.  Do not drive or use heavy machinery while taking prescription pain medicine.  To prevent or treat constipation while you are taking prescription pain medicine, your health  care provider may recommend that you: ? Drink enough fluid to keep your urine clear or pale yellow. ? Take over-the-counter or prescription medicines. ? Eat foods that are high in fiber, such as fresh fruits and vegetables, whole grains, and beans. ? Limit foods that are high in fat and processed sugars, such as fried and sweet foods. Lifestyle  Avoid alcohol use.  Do not use any products that contain nicotine or tobacco, such as cigarettes and e-cigarettes. If you need help quitting, ask your health care provider.  Get at least 8 hours of sleep every night.  Limit your stress. General instructions   Keep a journal to find out what may trigger your migraine headaches. For example, write down: ? What you eat and drink. ? How much sleep you get. ? Any change to your diet or medicines.  If you have a migraine: ? Avoid things that make your symptoms worse, such as bright lights. ? It may help to lie down in a dark, quiet room. ? Do not drive or use heavy machinery. ? Ask your health care provider what activities are safe for  you while you are experiencing symptoms.  Keep all follow-up visits as told by your health care provider. This is important. Contact a health care provider if:  You develop symptoms that are different or more severe than your usual migraine symptoms. Get help right away if:  Your migraine becomes severe.  You have a fever.  You have a stiff neck.  You have vision loss.  Your muscles feel weak or like you cannot control them.  You start to lose your balance often.  You develop trouble walking.  You faint. This information is not intended to replace advice given to you by your health care provider. Make sure you discuss any questions you have with your health care provider. Document Released: 09/25/2005 Document Revised: 04/14/2016 Document Reviewed: 03/13/2016 Elsevier Interactive Patient Education  2017 [employer].    Hypoglycemia Hypoglycemia is when the sugar (glucose) level in the blood is too low. Symptoms of low blood sugar may include:  Feeling: ? Hungry. ? Worried or nervous (anxious). ? Sweaty and clammy. ? Confused. ? Dizzy. ? Sleepy. ? Sick to your stomach (nauseous).  Having: ? A fast heartbeat. ? A headache. ? A change in your vision. ? Jerky movements that you cannot control (seizure). ? Nightmares. ? Tingling or no feeling (numbness) around the mouth, lips, or tongue.  Having trouble with: ? Talking. ? Paying attention (concentrating). ? Moving (coordination). ? Sleeping.  Shaking.  Passing out (fainting).  Getting upset easily (irritability).  Low blood sugar can happen to people who have diabetes and people who do not have diabetes. Low blood sugar can happen quickly, and it can be an emergency. Treating Low Blood Sugar Low blood sugar is often treated by eating or drinking something sugary right away. If you can think clearly and swallow safely, follow the 15:15 rule:  Take 15 grams of a fast-acting carb (carbohydrate). Some fast-acting carbs are: ? 1 tube of glucose gel. ? 3 sugar tablets (glucose pills). ? 6-8 pieces of hard candy. ? 4 oz (120 mL) of fruit juice. ? 4 oz (120 mL) of regular (not diet) soda.  Check your blood sugar 15 minutes after you take the carb.  If your blood sugar is still at or below 70 mg/dL (3.9 mmol/L), take 15 grams of a carb again.  If your blood sugar does not go above 70 mg/dL (3.9 mmol/L) after 3 tries, get help right away.  After your blood sugar goes back to normal, eat a meal or a snack within 1 hour.  Treating Very Low Blood Sugar If your blood sugar is at or below 54 mg/dL (3 mmol/L), you have very low blood sugar (severe hypoglycemia). This is an emergency. Do not wait to see if the symptoms will go away. Get medical help right away. Call your local emergency services (911 in the [place] Do not drive yourself to  the hospital. If you have very low blood sugar and you cannot eat or drink, you may need a glucagon shot (injection). A family member or friend should learn how to check your blood sugar and how to give you a glucagon shot. Ask your doctor if you need to have a glucagon shot kit at home. Follow these instructions at home: General instructions  Avoid any diets that cause you to not eat enough food. Talk with your doctor before you start any new diet.  Take over-the-counter and prescription medicines only as told by your doctor.  Limit alcohol to no more than  1 drink per day for nonpregnant women and 2 drinks per day for men. One drink equals 12 oz of beer, 5 oz of wine, or 1 oz of hard liquor.  Keep all follow-up visits as told by your doctor. This is important. If You Have Diabetes:   Make sure you know the symptoms of low blood sugar.  Always keep a source of sugar with you, such as: ? Sugar. ? Sugar tablets. ? Glucose gel. ? Fruit juice. ? Regular soda (not diet soda). ? Milk. ? Hard candy. ? Honey.  Take your medicines as told.  Follow your exercise and meal plan. ? Eat on time. Do not skip meals. ? Follow your sick day plan when you cannot eat or drink normally. Make this plan ahead of time with your doctor.  Check your blood sugar as often as told by your doctor. Always check before and after exercise.  Share your diabetes care plan with: ? Your work or school. ? People you live with.  Check your pee (urine) for ketones: ? When you are sick. ? As told by your doctor.  Carry a card or wear jewelry that says you have diabetes. If You Have Low Blood Sugar From Other Causes:   Check your blood sugar as often as told by your doctor.  Follow instructions from your doctor about what you cannot eat or drink. Contact a doctor if:  You have trouble keeping your blood sugar in your target range.  You have low blood sugar often. Get help right away if:  You still  have symptoms after you eat or drink something sugary.  Your blood sugar is at or below 54 mg/dL (3 mmol/L).  You have jerky movements that you cannot control.  You pass out. These symptoms may be an emergency. Do not wait to see if the symptoms will go away. Get medical help right away. Call your local emergency services (911 in the [place] Do not drive yourself to the hospital. This information is not intended to replace advice given to you by your health care provider. Make sure you discuss any questions you have with your health care provider. Document Released: 12/20/2009 Document Revised: 03/02/2016 Document Reviewed: 10/29/2015 Elsevier Interactive Patient Education  [employer].

## 2018-09-24 NOTE — Progress Notes (Addendum)
Chief Complaint  Patient presents with  . Follow-up   F/u  1. C/o frontal 10/10 h/a since weds/thurs when he cut down grams of sugar from 200-300 mg to 20 mg daily. Tylenol does not help he does have h/o migraines tried Replax in the past does not want to do these type of medications. Now sleeping 5-6 hours at night and out of school for break. He does need new glasses and has astigmatism and does not have new glasses yet 2. He had dental extraction w/in the last week right posterior tooth and on amoxicillin and hydrocodone   Review of Systems  Constitutional: Negative for weight loss.  HENT: Negative for hearing loss.   Eyes: Positive for photophobia. Negative for blurred vision.  Respiratory: Negative for shortness of breath.   Cardiovascular: Negative for chest pain.  Gastrointestinal: Negative for abdominal pain.  Musculoskeletal: Negative for falls.  Skin: Negative for rash.  Neurological: Positive for headaches.  Psychiatric/Behavioral: The patient is nervous/anxious.    Past Medical History:  Diagnosis Date  . Anxiety   . Depression   . 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 Headache   . Insomnia   . Kidney papillary necrosis (HCC)   . Kidney stones   . Kidney stones    Dr. Achilles Dunk   . Strep throat   . Tick bite    Past Surgical History:  Procedure Laterality Date  . HAND SURGERY Right    2012 right hand has metal plate   . 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 RECONSTRUCTION OF NOSE     2006    Family History  Problem Relation Age of Onset  . Cancer Mother        GU cancer   . Depression Mother   . Hypertension Mother   . Depression Sister   . Kidney disease Sister   . Arthritis Maternal Grandmother   . Cancer Maternal Grandmother        GU cancer   . Alcohol abuse Maternal Grandfather   . Cancer Other        GU cancer    Social History   Socioeconomic History   . Marital status: Married    Spouse name: Not on file  . Number of children: Not on file  . Years of education: Not on file  . Highest education level: Not on file  Occupational History  . Not on file  Social Needs  . Financial resource strain: Not on file  . Food insecurity:    Worry: Not on file    Inability: Not on file  . Transportation needs:    Medical: Not on file    Non-medical: Not on file  Tobacco Use  . Smoking status: Current Every Day Smoker    Packs/day: 0.50    Types: Cigarettes  . Smokeless tobacco: Never Used  Substance and Sexual Activity  . Alcohol use: Yes    Comment: socially  . Drug use: No  . Sexual activity: Yes  Lifestyle  . Physical activity:    Days per week: Not on file    Minutes per session: Not on file  . Stress: Not on file  Relationships  . Social connections:    Talks on phone: Not on file    Gets together: Not on file    Attends religious service:  Not on file    Active member of club or organization: Not on file    Attends meetings of clubs or organizations: Not on file    Relationship status: Not on file  . Intimate partner violence:    Fear of current or ex partner: Not on file    Emotionally abused: Not on file    Physically abused: Not on file    Forced sexual activity: Not on file  Other Topics Concern  . 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   Allergies  Allergen Reactions  . Lunesta [Eszopiclone]    No results found for this or any previous visit (from the past 2160 hour(s)). Objective  Body mass index is 19.61 kg/m. Wt Readings from Last 3 Encounters:  09/24/18 132 lb 12.8 oz (60.2 kg)  07/25/18 132 lb (59.9 kg)  05/10/18 128 lb 2 oz (58.1  kg)   Temp Readings from Last 3 Encounters:  09/24/18 97.8 F (36.6 C) (Oral)  07/25/18 98.4 F (36.9 C) (Oral)  05/10/18 97.6 F (36.4 C) (Oral)   BP Readings from Last 3 Encounters:  09/24/18 106/68  07/25/18 98/70  05/10/18 115/78   Pulse Readings from Last 3 Encounters:  09/24/18 (!) 59  07/25/18 61  05/10/18 66    Physical Exam Vitals signs and nursing note reviewed.  Constitutional:      Appearance: Normal appearance. He is well-developed.  HENT:     Head: Normocephalic and atraumatic.     Nose: Nose normal.     Mouth/Throat:     Mouth: Mucous membranes are moist.     Pharynx: Oropharynx is clear.  Eyes:     Conjunctiva/sclera: Conjunctivae normal.     Pupils: Pupils are equal, round, and reactive to light.  Cardiovascular:     Rate and Rhythm: Normal rate and regular rhythm.     Heart sounds: Normal heart sounds.  Pulmonary:     Effort: Pulmonary effort is normal.     Breath sounds: Normal breath sounds.  Skin:    General: Skin is warm and dry.  Neurological:     General: No focal deficit present.     Mental Status: He is alert and oriented to person, place, and time.     Gait: Gait normal.  Psychiatric:        Attention and Perception: Attention and perception normal.        Mood and Affect: Mood and affect normal.        Speech: Speech normal.        Behavior: Behavior normal. Behavior is cooperative.        Thought Content: Thought content normal.        Cognition and Memory: Cognition and memory normal.        Judgment: Judgment normal.     Assessment   1. Frontal h/a h/o migraines  2. S/p dental extraction  3. HM Plan  1. Prn Tylenol  Try magnesium 250-400 mg qd and vitamin B2 200 mg bid  Increase sleep to 6 hrs  Declines meds for now Rx  Encouraged to get and wear glasses 2. Warm salt water gargles  3.  Declines flu  Tdap had 10/01/17  hep B immune Declines STD testing  rec smoking cessation congratulated on no cig x 48 hrs   -needs repeat echo by 03/30/2020 had 03/31/15 mild valve regurgitation mild MVP repeat echo 3-5 years w/o sxs -still consider CT chest ground glass nodule noted on prior imaging  -pt as of 09/24/18 still not ready to sch CT chest   ENT will see [facility] 10/2018    Provider: Dr. French Ana McLean-Scocuzza-Internal Medicine   Pre visit review using our clinic review tool, if applicable. No additional management support is needed unless otherwise documented below in the visit note.

## 2018-11-01 DIAGNOSIS — B309 Viral conjunctivitis, unspecified: Secondary | ICD-10-CM | POA: Diagnosis not present

## 2018-12-24 ENCOUNTER — Other Ambulatory Visit: Payer: Self-pay

## 2019-02-11 ENCOUNTER — Ambulatory Visit: Payer: Self-pay | Admitting: Internal Medicine

## 2019-03-26 ENCOUNTER — Other Ambulatory Visit: Payer: Self-pay

## 2019-04-30 ENCOUNTER — Other Ambulatory Visit (INDEPENDENT_AMBULATORY_CARE_PROVIDER_SITE_OTHER): Payer: BC Managed Care – PPO

## 2019-04-30 ENCOUNTER — Other Ambulatory Visit: Payer: Self-pay

## 2019-04-30 DIAGNOSIS — Z13220 Encounter for screening for lipoid disorders: Secondary | ICD-10-CM

## 2019-04-30 DIAGNOSIS — E559 Vitamin D deficiency, unspecified: Secondary | ICD-10-CM | POA: Diagnosis not present

## 2019-04-30 DIAGNOSIS — Z0000 Encounter for general adult medical examination without abnormal findings: Secondary | ICD-10-CM | POA: Diagnosis not present

## 2019-04-30 DIAGNOSIS — Z0184 Encounter for antibody response examination: Secondary | ICD-10-CM

## 2019-04-30 DIAGNOSIS — Z20822 Contact with and (suspected) exposure to covid-19: Secondary | ICD-10-CM

## 2019-04-30 DIAGNOSIS — Z1159 Encounter for screening for other viral diseases: Secondary | ICD-10-CM

## 2019-04-30 DIAGNOSIS — Z1329 Encounter for screening for other suspected endocrine disorder: Secondary | ICD-10-CM | POA: Diagnosis not present

## 2019-04-30 DIAGNOSIS — Z1389 Encounter for screening for other disorder: Secondary | ICD-10-CM

## 2019-04-30 LAB — COMPREHENSIVE METABOLIC PANEL
ALT: 12 U/L (ref 0–53)
AST: 18 U/L (ref 0–37)
Albumin: 5.1 g/dL (ref 3.5–5.2)
Alkaline Phosphatase: 61 U/L (ref 39–117)
BUN: 10 mg/dL (ref 6–23)
CO2: 29 mEq/L (ref 19–32)
Calcium: 10.1 mg/dL (ref 8.4–10.5)
Chloride: 102 mEq/L (ref 96–112)
Creatinine, Ser: 0.9 mg/dL (ref 0.40–1.50)
GFR: 100.32 mL/min (ref >60.00)
Glucose, Bld: 89 mg/dL (ref 70–99)
Potassium: 4.3 mEq/L (ref 3.5–5.1)
Sodium: 139 mEq/L (ref 135–145)
Total Bilirubin: 1.3 mg/dL — ABNORMAL HIGH (ref 0.2–1.2)
Total Protein: 7.5 g/dL (ref 6.0–8.3)

## 2019-04-30 LAB — URINALYSIS, ROUTINE W REFLEX MICROSCOPIC
Bilirubin Urine: NEGATIVE
Hgb urine dipstick: NEGATIVE
Ketones, ur: NEGATIVE
Leukocytes,Ua: NEGATIVE
Nitrite: NEGATIVE
RBC / HPF: NONE SEEN
Specific Gravity, Urine: 1.01 (ref 1.000–1.030)
Total Protein, Urine: NEGATIVE
Urine Glucose: NEGATIVE
Urobilinogen, UA: 0.2 (ref 0.0–1.0)
pH: 6.5 (ref 5.0–8.0)

## 2019-04-30 LAB — LIPID PANEL
Cholesterol: 150 mg/dL (ref 0–200)
HDL: 48.3 mg/dL (ref >39.00)
LDL Cholesterol: 89 mg/dL (ref 0–99)
NonHDL: 102.04
Total CHOL/HDL Ratio: 3
Triglycerides: 63 mg/dL (ref 0.0–149.0)
VLDL: 12.6 mg/dL (ref 0.0–40.0)

## 2019-04-30 LAB — CBC WITH DIFFERENTIAL/PLATELET
Basophils Absolute: 0.1 10*3/uL (ref 0.0–0.1)
Basophils Relative: 1.9 % (ref 0.0–3.0)
Eosinophils Absolute: 0.7 10*3/uL (ref 0.0–0.7)
Eosinophils Relative: 13.9 % — ABNORMAL HIGH (ref 0.0–5.0)
HCT: 50.7 % (ref 39.0–52.0)
Hemoglobin: 17.2 g/dL — ABNORMAL HIGH (ref 13.0–17.0)
Lymphocytes Relative: 41.3 % (ref 12.0–46.0)
Lymphs Abs: 2.2 10*3/uL (ref 0.7–4.0)
MCHC: 33.8 g/dL (ref 30.0–36.0)
MCV: 93.5 fl (ref 78.0–100.0)
Monocytes Absolute: 0.4 10*3/uL (ref 0.1–1.0)
Monocytes Relative: 7.4 % (ref 3.0–12.0)
Neutro Abs: 1.9 10*3/uL (ref 1.4–7.7)
Neutrophils Relative %: 35.5 % — ABNORMAL LOW (ref 43.0–77.0)
Platelets: 236 10*3/uL (ref 150.0–400.0)
RBC: 5.42 Mil/uL (ref 4.22–5.81)
RDW: 13.1 % (ref 11.5–15.5)
WBC: 5.3 10*3/uL (ref 4.0–10.5)

## 2019-04-30 LAB — VITAMIN D 25 HYDROXY (VIT D DEFICIENCY, FRACTURES): VITD: 22.57 ng/mL — ABNORMAL LOW (ref 30.00–100.00)

## 2019-04-30 LAB — TSH: TSH: 1.16 u[IU]/mL (ref 0.35–4.50)

## 2019-05-01 LAB — MEASLES/MUMPS/RUBELLA IMMUNITY
Mumps IgG: 92.1 AU/mL
Rubella: 1.11 index
Rubeola IgG: 110 AU/mL

## 2019-05-03 LAB — NOVEL CORONAVIRUS, NAA: SARS-CoV-2, NAA: NOT DETECTED

## 2019-05-08 ENCOUNTER — Encounter: Payer: Self-pay | Admitting: Internal Medicine

## 2019-06-10 ENCOUNTER — Other Ambulatory Visit: Payer: Self-pay

## 2019-06-12 ENCOUNTER — Encounter: Payer: Self-pay | Admitting: Internal Medicine

## 2019-06-12 ENCOUNTER — Other Ambulatory Visit: Payer: Self-pay

## 2019-06-12 ENCOUNTER — Ambulatory Visit (INDEPENDENT_AMBULATORY_CARE_PROVIDER_SITE_OTHER): Payer: Self-pay | Admitting: Internal Medicine

## 2019-06-12 VITALS — BP 96/54 | HR 56 | Temp 97.9°F | Ht 69.0 in | Wt 131.8 lb

## 2019-06-12 DIAGNOSIS — D751 Secondary polycythemia: Secondary | ICD-10-CM

## 2019-06-12 DIAGNOSIS — F5104 Psychophysiologic insomnia: Secondary | ICD-10-CM

## 2019-06-12 DIAGNOSIS — M7651 Patellar tendinitis, right knee: Secondary | ICD-10-CM

## 2019-06-12 DIAGNOSIS — R918 Other nonspecific abnormal finding of lung field: Secondary | ICD-10-CM

## 2019-06-12 DIAGNOSIS — E611 Iron deficiency: Secondary | ICD-10-CM

## 2019-06-12 DIAGNOSIS — E559 Vitamin D deficiency, unspecified: Secondary | ICD-10-CM

## 2019-06-12 DIAGNOSIS — Z0000 Encounter for general adult medical examination without abnormal findings: Secondary | ICD-10-CM

## 2019-06-12 MED ORDER — DICLOFENAC SODIUM 1 % TD GEL: 2.0000 g | Freq: Four times a day (QID) | TRANSDERMAL | 11 refills | Status: DC

## 2019-06-12 NOTE — Patient Instructions (Addendum)
Stress relax L theanine or Tranquil sleep  dont forget about echo 03/2020 to follow up mitral valve prolapse issues   And CT chest to follow up on ground glass changes of your lung  Take vitamin D3 5000 IU daily    Patellar Tendinitis  Patellar tendinitis is also called jumper's knee or patellar tendinopathy. This condition happens when there is damage to and inflammation of the patellar tendon. Tendons are cord-like tissues that connect muscles to bones. The patellar tendon connects the bottom of the kneecap (patella) to the top of the shin bone (tibia). Patellar tendinitis causes pain in the front of the knee. The condition happens in the following stages:  Stage 1: In this stage, you have pain only after activity.  Stage 2: In this stage, you have pain during and after activity.  Stage 3: In this stage, you have pain at rest as well as during and after activity. The pain limits your ability to do the activity.  Stage 4: In this stage, the tendon tears and severely limits your activity. What are the causes? This condition is caused by repeated (repetitive) stress on the tendon. This stress may cause the tendon to stretch, swell, thicken, or tear. What increases the risk? The following factors may make you more likely to develop this condition:  Participating in sports that involve running, kicking, and jumping, especially on hard surfaces. These include: ? Basketball. ? Volleyball. ? Soccer. ? Track and field.  Training too hard.  Having tight thigh muscles.  Having received steroid injections in the tendon.  Having had knee surgery.  Being 51-72 years old.  Having rheumatoid arthritis, diabetes, or kidney disease. These conditions interrupt blood flow to the knee, causing the tendon to weaken. What are the signs or symptoms? The main symptom of this condition is pain and swelling in the front of the knee. The pain usually starts slowly and gradually gets worse. It may  become painful to straighten your leg. The pain may get worse when you walk, run, or jump. How is this diagnosed? This condition may be diagnosed based on:  Your symptoms.  Your medical history.  A physical exam. During the physical exam, your health care provider may check for: ? Tenderness in your patella. ? Tightness in your thigh muscles. ? Pain when you straighten your knee.  Imaging tests, including: ? X-rays. These will show the position and condition of your patella. ? An MRI. This will show any abnormality of the tendon. ? Ultrasound. This will show any swelling or other abnormalities of the tendon. How is this treated? Treatment for this condition depends on the stage of the condition. It may involve:  Avoiding activities that cause pain, such as jumping.  Icing and elevating your knee.  Having sound wave stimulation to promote healing.  Doing stretching and strengthening exercises (physical therapy) when pain and swelling improve.  Wearing a knee brace. This may be needed if your condition does not improve with treatment.  Using crutches or a walker. This may be needed if your condition does not improve with treatment.  Surgery. This may be done if you have stage 4 tendinitis. Follow these instructions at home: If you have a brace:  Wear the brace as told by your health care provider. Remove it only as told by your health care provider.  Loosen the brace if your toes tingle, become numb, or turn cold and blue.  Keep the brace clean.  If the brace is not waterproof: ?  Do not let it get wet. ? Cover it with a watertight covering when you take a bath or shower.  Ask your health care provider when it is safe for you to drive. Managing pain, stiffness, and swelling   If directed, put ice on the injured area. ? If you have a removable brace, remove it as told by your health care provider. ? Put ice in a plastic bag. ? Place a towel between your skin and the  bag. ? Leave the ice on for 20 minutes, 2-3 times a day.  Move your toes often to reduce stiffness and swelling.  Raise (elevate) your knee above the level of your heart while you are sitting or lying down. Activity  Do not use the injured limb to support your body weight until your health care provider says that you can. Use your crutches or a walker as told by your health care provider.  Return to your normal activities as told by your health care provider. Ask your health care provider what activities are safe for you.  Do exercises as told by your health care provider or physical therapist. General instructions  Take over-the-counter and prescription medicines only as told by your health care provider.  Do not use any products that contain nicotine or tobacco, such as cigarettes, e-cigarettes, and chewing tobacco. These can delay healing. If you need help quitting, ask your health care provider.  Keep all follow-up visits as told by your health care provider. This is important. How is this prevented?  Warm up and stretch before being active.  Cool down and stretch after being active.  Give your body time to rest between periods of activity. ? You may need to reduce how often you play a sport that requires frequent jumping.  Make sure to use equipment that fits you.  Be safe and responsible while being active. This will help you avoid falls which can damage the tendon.  Do at least 150 minutes of moderate-intensity exercise each week, such as brisk walking or water aerobics.  Maintain physical fitness, including: ? Strength. ? Flexibility. ? Cardiovascular fitness. ? Endurance. Contact a health care provider if:  Your symptoms have not improved in 6 weeks.  Your symptoms get worse. Summary  Patellar tendinitis is also called jumper's knee or patellar tendinopathy. This condition happens when there is damage to and inflammation of the patellar tendon.  Treatment for  this condition depends on the stage of the condition and may include rest, ice, exercises, medicines, and surgery.  Do not use the injured limb to support your body weight until your health care provider says that you can.  Take over-the-counter and prescription medicines only as told by your health care provider.  Keep all follow-up visits as told by your health care provider. This is important. This information is not intended to replace advice given to you by your health care provider. Make sure you discuss any questions you have with your health care provider. Document Released: 09/25/2005 Document Revised: 01/16/2019 Document Reviewed: 08/19/2018 Elsevier Patient Education  2020 [employer]  Insomnia Insomnia is a sleep disorder that makes it difficult to fall asleep or stay asleep. Insomnia can cause fatigue, low energy, difficulty concentrating, mood swings, and poor performance at work or school. There are three different ways to classify insomnia:  Difficulty falling asleep.  Difficulty staying asleep.  Waking up too early in the morning. Any type of insomnia can be long-term (chronic) or short-term (acute). Both are common.  Short-term insomnia usually lasts for three months or less. Chronic insomnia occurs at least three times a week for longer than three months. What are the causes? Insomnia may be caused by another condition, situation, or substance, such as:  Anxiety.  Certain medicines.  Gastroesophageal reflux disease (GERD) or other gastrointestinal conditions.  Asthma or other breathing conditions.  Restless legs syndrome, sleep apnea, or other sleep disorders.  Chronic pain.  Menopause.  Stroke.  Abuse of alcohol, tobacco, or illegal drugs.  Mental health conditions, such as depression.  Caffeine.  Neurological disorders, such as Alzheimer's disease.  An overactive thyroid (hyperthyroidism). Sometimes, the cause of insomnia may not be known. What  increases the risk? Risk factors for insomnia include:  Gender. Women are affected more often than men.  Age. Insomnia is more common as you get older.  Stress.  Lack of exercise.  Irregular work schedule or working night shifts.  Traveling between different time zones.  Certain medical and mental health conditions. What are the signs or symptoms? If you have insomnia, the main symptom is having trouble falling asleep or having trouble staying asleep. This may lead to other symptoms, such as:  Feeling fatigued or having low energy.  Feeling nervous about going to sleep.  Not feeling rested in the morning.  Having trouble concentrating.  Feeling irritable, anxious, or depressed. How is this diagnosed? This condition may be diagnosed based on:  Your symptoms and medical history. Your health care provider may ask about: ? Your sleep habits. ? Any medical conditions you have. ? Your mental health.  A physical exam. How is this treated? Treatment for insomnia depends on the cause. Treatment may focus on treating an underlying condition that is causing insomnia. Treatment may also include:  Medicines to help you sleep.  Counseling or therapy.  Lifestyle adjustments to help you sleep better. Follow these instructions at home: Eating and drinking   Limit or avoid alcohol, caffeinated beverages, and cigarettes, especially close to bedtime. These can disrupt your sleep.  Do not eat a large meal or eat spicy foods right before bedtime. This can lead to digestive discomfort that can make it hard for you to sleep. Sleep habits   Keep a sleep diary to help you and your health care provider figure out what could be causing your insomnia. Write down: ? When you sleep. ? When you wake up during the night. ? How well you sleep. ? How rested you feel the next day. ? Any side effects of medicines you are taking. ? What you eat and drink.  Make your bedroom a dark,  comfortable place where it is easy to fall asleep. ? Put up shades or blackout curtains to block light from outside. ? Use a white noise machine to block noise. ? Keep the temperature cool.  Limit screen use before bedtime. This includes: ? Watching TV. ? Using your smartphone, tablet, or computer.  Stick to a routine that includes going to bed and waking up at the same times every day and night. This can help you fall asleep faster. Consider making a quiet activity, such as reading, part of your nighttime routine.  Try to avoid taking naps during the day so that you sleep better at night.  Get out of bed if you are still awake after 15 minutes of trying to sleep. Keep the lights down, but try reading or doing a quiet activity. When you feel sleepy, go back to bed. General instructions  Take over-the-counter  and prescription medicines only as told by your health care provider.  Exercise regularly, as told by your health care provider. Avoid exercise starting several hours before bedtime.  Use relaxation techniques to manage stress. Ask your health care provider to suggest some techniques that may work well for you. These may include: ? Breathing exercises. ? Routines to release muscle tension. ? Visualizing peaceful scenes.  Make sure that you drive carefully. Avoid driving if you feel very sleepy.  Keep all follow-up visits as told by your health care provider. This is important. Contact a health care provider if:  You are tired throughout the day.  You have trouble in your daily routine due to sleepiness.  You continue to have sleep problems, or your sleep problems get worse. Get help right away if:  You have serious thoughts about hurting yourself or someone else. If you ever feel like you may hurt yourself or others, or have thoughts about taking your own life, get help right away. You can go to your nearest emergency department or call:  Your local emergency services (911  in the [place]  A suicide crisis helpline, such as the National Suicide Prevention Lifeline at 680-260-92001-802-569-1248. This is open 24 hours a day. Summary  Insomnia is a sleep disorder that makes it difficult to fall asleep or stay asleep.  Insomnia can be long-term (chronic) or short-term (acute).  Treatment for insomnia depends on the cause. Treatment may focus on treating an underlying condition that is causing insomnia.  Keep a sleep diary to help you and your health care provider figure out what could be causing your insomnia. This information is not intended to replace advice given to you by your health care provider. Make sure you discuss any questions you have with your health care provider. Document Released: 09/22/2000 Document Revised: 09/07/2017 Document Reviewed: 07/05/2017 [employer] Patient Education  2020 [employer]

## 2019-06-12 NOTE — Progress Notes (Signed)
Chief Complaint  Patient presents with  . Annual Exam   Annual doing well  1. Reviewed labs 04/30/19 total bili 1.2 and vitamin D 22.57 (currently taking 1000 IU D3), cbc 17.2 he stopped smoking cigarettes in 09/2018 but still smokes THC  2. C/o right patella tendonitis history has seen [facility] and needs to do stretches  3. C/o chronic insomnia wants to avoid addictive meds for sleep disc holistic options otc today he reports THC smokes qhs to sleep 4. H/o CT ab/pelvis with ground glass appearance in lungs wants to still hold on CT chest  5. H/o MVP no sob/CP wants to hold on repeat echo   Review of Systems  Constitutional: Negative for weight loss.  HENT: Negative for hearing loss.   Eyes: Negative for blurred vision.  Respiratory: Negative for shortness of breath.   Cardiovascular: Negative for chest pain.  Gastrointestinal: Negative for abdominal pain.  Musculoskeletal: Positive for joint pain. Negative for falls.  Skin: Negative for rash.       Prior bite black ant left wrist was red and swollen now resolved    Neurological: Negative for headaches.  Psychiatric/Behavioral: The patient has insomnia.    Past Medical History:  Diagnosis Date  . Anxiety   . Depression   . Drug abuse ([place])    h/o THC and ectasy use per pt clean since 2017 started drugs in HS  . Headache   . Insomnia   . 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 Kidney stones   . Kidney stones    Dr. Jacqlyn Larsen   . Strep throat   . Tick bite    Past Surgical History:  Procedure Laterality Date  . HAND SURGERY Right    2012 right hand has metal plate   . 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 RECONSTRUCTION OF NOSE     2006    Family History  Problem Relation Age of Onset  . Cancer Mother        GU cancer   . Depression Mother   . Hypertension Mother   . Depression Sister   . Kidney disease Sister   . Arthritis Maternal Grandmother   . Cancer Maternal Grandmother         GU cancer   . Alcohol abuse Maternal Grandfather   . Cancer Other        GU cancer    Social History   Socioeconomic History  . Marital status: Married    Spouse name: Not on file  . Number of children: Not on file  . Years of education: Not on file  . Highest education level: Not on file  Occupational History  . Not on file  Social Needs  . Financial resource strain: Not on file  . Food insecurity    Worry: Not on file    Inability: Not on file  . Transportation needs    Medical: Not on file    Non-medical: Not on file  Tobacco Use  . Smoking status: Current Every Day Smoker    Packs/day: 0.50    Types: Cigarettes  . Smokeless tobacco: Never Used  Substance and Sexual Activity  . Alcohol use: Yes    Comment: socially  . Drug use: No  . Sexual activity: Yes  Lifestyle  . Physical activity    Days per week: Not on file    Minutes per session: Not on file  .  Stress: Not on file  Relationships  . Social Herbalist on phone: Not on file    Gets together: Not on file    Attends religious service: Not on file    Active member of club or organization: Not on file    Attends meetings of clubs or organizations: Not on file    Relationship status: Not on file  . Intimate partner violence    Fear of current or ex partner: Not on file    Emotionally abused: Not on file    Physically abused: Not on file    Forced sexual activity: Not on file  Other Topics Concern  . 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   Allergies  Allergen Reactions  . Lunesta [Eszopiclone]    Recent Results (from the past 2160 hour(s))  Novel Coronavirus, NAA ([facility])     Status: None   Collection Time: 04/30/19 12:00 AM  Result Value Ref Range   SARS-CoV-2, NAA Not Detected Not Detected    Comment: This test was developed and its performance characteristics determined by [employer]. This test has not been FDA cleared or approved. This test has been authorized by FDA under an Emergency Use Authorization (EUA). This test is only authorized for the duration of time the declaration that circumstances exist justifying the authorization of the emergency use of in vitro diagnostic tests for detection of SARS-CoV-2 virus and/or diagnosis of COVID-19 infection under section 564(b)(1) of the Act, 21 U.S.C. 185UDJ-4(H)(7), unless the authorization is terminated or revoked sooner. When diagnostic testing is negative, the possibility of a false negative result should be considered in the context of a patient's recent exposures and the presence of clinical signs and symptoms consistent with COVID-19. An individual without symptoms of COVID-19 and who is not shedding SARS-CoV-2 virus would expect to have a negative (not detected) result in this assay.   Measles/Mumps/Rubella Immunity     Status: None   Collection Time: 04/30/19  8:55 AM  Result Value Ref Range   R[place]ola IgG 110.00 AU/mL    Comment: AU/mL            Interpretation -----            -------------- <13.50           Negative 13.50-16.49      Equivocal >16.49           Positive . A positive result indicates that the patient has antibody to measles virus. It does not differentiate  between an active or past infection. The clinical  diagnosis must be interpreted in conjunction with  clinical signs and symptoms of the patient.    Mumps IgG 92.10 AU/mL    Comment:  AU/mL            Interpretation -------         ---------------- <9.00  Negative 9.00-10.99        Equivocal >10.99            Positive A positive result indicates that the patient has  antibody to mumps virus. It does not differentiate between an  active or past infection. The clinical diagnosis must be interpreted in conjunction with clinical signs and symptoms of the patient. .    Rubella 1.11 index    Comment:     Index            Interpretation     -----            --------------       <0.90            Not consistent with Immunity     0.90-0.99        Equivocal     > or = 1.00      Consistent with Immunity  . The presence of rubella IgG antibody suggests  immunization or past or current infection with rubella virus.   TSH     Status: None   Collection Time: 04/30/19  8:55 AM  Result Value Ref Range   TSH 1.16 0.35 - 4.50 uIU/mL  Urinalysis, Routine w reflex microscopic     Status: None   Collection Time: 04/30/19  8:55 AM  Result Value Ref Range   Color, Urine YELLOW Yellow;Lt. Yellow;Straw;Dark Yellow;Amber;Green;Red;Brown   APPearance CLEAR Clear;Turbid;Slightly Cloudy;Cloudy   Specific Gravity, Urine 1.010 1.000 - 1.030   pH 6.5 5.0 - 8.0   Total Protein, Urine NEGATIVE Negative   Urine Glucose NEGATIVE Negative   Ketones, ur NEGATIVE Negative   Bilirubin Urine NEGATIVE Negative   Hgb urine dipstick NEGATIVE Negative   Urobilinogen, UA 0.2 0.0 - 1.0   Leukocytes,Ua NEGATIVE Negative   Nitrite NEGATIVE Negative   WBC, UA 0-2/hpf 0-2/hpf   RBC / HPF none seen 0-2/hpf  Lipid panel     Status: None   Collection Time: 04/30/19  8:55 AM  Result Value Ref Range   Cholesterol 150 0 - 200 mg/dL    Comment: ATP III Classification       Desirable:  < 200 mg/dL               Borderline High:  200 - 239 mg/dL          High:  > = 240 mg/dL   Triglycerides 63.0 0.0 - 149.0 mg/dL    Comment: Normal:  <150 mg/dLBorderline High:  150 - 199 mg/dL   HDL 48.30 >39.00 mg/dL   VLDL  12.6 0.0 - 40.0 mg/dL   LDL Cholesterol 89 0 - 99 mg/dL   Total CHOL/HDL Ratio 3     Comment:                Men          Women1/2 Average Risk     3.4          3.3Average Risk          5.0          4.42X Average Risk          9.6          7.13X Average Risk          15.0          11.0                       NonHDL 102.04  Comment: NOTE:  Non-HDL goal should be 30 mg/dL higher than patient's LDL goal (i.e. LDL goal of < 70 mg/dL, would have non-HDL goal of < 100 mg/dL)  Vitamin D (25 hydroxy)     Status: Abnormal   Collection Time: 04/30/19  8:55 AM  Result Value Ref Range   VITD 22.57 (L) 30.00 - 100.00 ng/mL  CBC with Differential/Platelet     Status: Abnormal   Collection Time: 04/30/19  8:55 AM  Result Value Ref Range   WBC 5.3 4.0 - 10.5 K/uL   RBC 5.42 4.22 - 5.81 Mil/uL   Hemoglobin 17.2 (H) 13.0 - 17.0 g/dL   HCT 50.7 39.0 - 52.0 %   MCV 93.5 78.0 - 100.0 fl   MCHC 33.8 30.0 - 36.0 g/dL   RDW 13.1 11.5 - 15.5 %   Platelets 236.0 150.0 - 400.0 K/uL   Neutrophils Relative % 35.5 (L) 43.0 - 77.0 %   Lymphocytes Relative 41.3 12.0 - 46.0 %   Monocytes Relative 7.4 3.0 - 12.0 %   Eosinophils Relative 13.9 (H) 0.0 - 5.0 %   Basophils Relative 1.9 0.0 - 3.0 %   Neutro Abs 1.9 1.4 - 7.7 K/uL   Lymphs Abs 2.2 0.7 - 4.0 K/uL   Monocytes Absolute 0.4 0.1 - 1.0 K/uL   Eosinophils Absolute 0.7 0.0 - 0.7 K/uL   Basophils Absolute 0.1 0.0 - 0.1 K/uL  Comprehensive metabolic panel     Status: Abnormal   Collection Time: 04/30/19  8:55 AM  Result Value Ref Range   Sodium 139 135 - 145 mEq/L   Potassium 4.3 3.5 - 5.1 mEq/L   Chloride 102 96 - 112 mEq/L   CO2 29 19 - 32 mEq/L   Glucose, Bld 89 70 - 99 mg/dL   BUN 10 6 - 23 mg/dL   Creatinine, Ser 0.90 0.40 - 1.50 mg/dL   Total Bilirubin 1.3 (H) 0.2 - 1.2 mg/dL   Alkaline Phosphatase 61 39 - 117 U/L   AST 18 0 - 37 U/L   ALT 12 0 - 53 U/L   Total Protein 7.5 6.0 - 8.3 g/dL   Albumin 5.1 3.5 - 5.2 g/dL   Calcium 10.1 8.4 - 10.5  mg/dL   GFR 100.32 >60.00 mL/min   Objective  Body mass index is 19.46 kg/m. Wt Readings from Last 3 Encounters:  06/12/19 131 lb 12.8 oz (59.8 kg)  09/24/18 132 lb 12.8 oz (60.2 kg)  07/25/18 132 lb (59.9 kg)   Temp Readings from Last 3 Encounters:  06/12/19 97.9 F (36.6 C) (Oral)  09/24/18 97.8 F (36.6 C) (Oral)  07/25/18 98.4 F (36.9 C) (Oral)   BP Readings from Last 3 Encounters:  06/12/19 (!) 96/54  09/24/18 106/68  07/25/18 98/70   Pulse Readings from Last 3 Encounters:  06/12/19 (!) 56  09/24/18 (!) 59  07/25/18 61    Physical Exam Vitals signs and nursing note reviewed.  Constitutional:      Appearance: Normal appearance. He is well-developed and well-groomed.  HENT:     Head: Normocephalic and atraumatic.     Comments: +mask on   Cardiovascular:     Rate and Rhythm: Normal rate and regular rhythm.     Heart sounds: Normal heart sounds. No murmur.  Pulmonary:     Effort: Pulmonary effort is normal.     Breath sounds: Normal breath sounds.  Abdominal:     General: Bowel sounds are normal.     Tenderness:  There is no abdominal tenderness.  Musculoskeletal:     Right knee: Tenderness found. Patellar tendon tenderness noted.  Skin:    General: Skin is warm and dry.     Comments: Resolved left wrist dorsal ant bite  Tattoos to back  Benign nevi     Neurological:     General: No focal deficit present.     Mental Status: He is alert and oriented to person, place, and time. Mental status is at baseline.     Gait: Gait normal.  Psychiatric:        Attention and Perception: Attention and perception normal.        Mood and Affect: Mood and affect normal.        Speech: Speech normal.        Behavior: Behavior normal. Behavior is cooperative.        Thought Content: Thought content normal.        Cognition and Memory: Cognition and memory normal.        Judgment: Judgment normal.     Assessment  Plan  Annual physical exam Declines flu  Tdap  had 10/01/17  hep B immune MMR immune Declines STD testing  rec smoking cessationcongratulated on no cig since 09/2018 but do rec stop THC use   -needs repeat echo by 03/30/2020 had 03/31/15 mild valve regurgitation mild MVP repeat echo 3-5 years w/o sxs -as of 06/12/19 declines for now   -still consider CT chest ground glass nodule CT ab/pelvis 10/30/16 in right lower lobe 5 mm  noted on prior imaging -pt as of 06/12/2019 still not ready to sch CT chest   Patellar tendinitis of right knee - Plan: diclofenac sodium (VOLTAREN) 1 % GEL rec ice, stretching, patella taping/straps  Consider f/u [facility] prn   Polycythemia -  He has co detectors in his house, still smoking THC, has not been in high altitude    Plan: CBC with Differential/Platelet, Iron, TIBC and Ferritin Panel, EPO and JAK 2 in future -pt wants to do this another date does not like lab draws  -rec smoking cessation   Chronic insomnia  -disc stress Relax brand L theanine or Tranquil sleep  Declines Rx sleep aids due to history of addiction also tried trazadone in the past and did not like this   Vit D def  -rec D3 no more than 5000 IU daily otc he currently is taking 1000 IU qd   Prev pt informed me ENT will see [facility] 10/2018 but no note from this encouter   Provider: Dr. Olivia Mackie McLean-Scocuzza-Internal Medicine

## 2019-06-13 ENCOUNTER — Encounter: Payer: Self-pay | Admitting: Internal Medicine

## 2019-06-13 DIAGNOSIS — D751 Secondary polycythemia: Secondary | ICD-10-CM | POA: Insufficient documentation

## 2019-06-13 DIAGNOSIS — Z87442 Personal history of urinary calculi: Secondary | ICD-10-CM | POA: Insufficient documentation

## 2019-06-13 DIAGNOSIS — R918 Other nonspecific abnormal finding of lung field: Secondary | ICD-10-CM | POA: Insufficient documentation

## 2019-06-13 DIAGNOSIS — M7651 Patellar tendinitis, right knee: Secondary | ICD-10-CM | POA: Insufficient documentation

## 2019-06-13 DIAGNOSIS — N172 Acute kidney failure with medullary necrosis: Secondary | ICD-10-CM | POA: Insufficient documentation

## 2019-06-13 DIAGNOSIS — Z0000 Encounter for general adult medical examination without abnormal findings: Secondary | ICD-10-CM | POA: Insufficient documentation

## 2019-12-11 ENCOUNTER — Ambulatory Visit: Payer: Self-pay | Admitting: Internal Medicine

## 2019-12-30 ENCOUNTER — Other Ambulatory Visit: Payer: Self-pay

## 2020-01-01 ENCOUNTER — Other Ambulatory Visit: Payer: Self-pay

## 2020-01-01 ENCOUNTER — Ambulatory Visit (INDEPENDENT_AMBULATORY_CARE_PROVIDER_SITE_OTHER): Payer: Self-pay | Admitting: Internal Medicine

## 2020-01-01 ENCOUNTER — Telehealth: Payer: Self-pay | Admitting: Internal Medicine

## 2020-01-01 ENCOUNTER — Encounter: Payer: Self-pay | Admitting: Internal Medicine

## 2020-01-01 VITALS — BP 112/70 | HR 73 | Temp 97.0°F | Ht 69.0 in | Wt 138.2 lb

## 2020-01-01 DIAGNOSIS — F419 Anxiety disorder, unspecified: Secondary | ICD-10-CM

## 2020-01-01 DIAGNOSIS — Z13220 Encounter for screening for lipoid disorders: Secondary | ICD-10-CM

## 2020-01-01 DIAGNOSIS — G8929 Other chronic pain: Secondary | ICD-10-CM | POA: Insufficient documentation

## 2020-01-01 DIAGNOSIS — I341 Nonrheumatic mitral (valve) prolapse: Secondary | ICD-10-CM

## 2020-01-01 DIAGNOSIS — Z0000 Encounter for general adult medical examination without abnormal findings: Secondary | ICD-10-CM

## 2020-01-01 DIAGNOSIS — Z1329 Encounter for screening for other suspected endocrine disorder: Secondary | ICD-10-CM

## 2020-01-01 DIAGNOSIS — F410 Panic disorder [episodic paroxysmal anxiety] without agoraphobia: Secondary | ICD-10-CM | POA: Insufficient documentation

## 2020-01-01 DIAGNOSIS — J309 Allergic rhinitis, unspecified: Secondary | ICD-10-CM

## 2020-01-01 DIAGNOSIS — D751 Secondary polycythemia: Secondary | ICD-10-CM

## 2020-01-01 DIAGNOSIS — M25572 Pain in left ankle and joints of left foot: Secondary | ICD-10-CM

## 2020-01-01 DIAGNOSIS — R918 Other nonspecific abnormal finding of lung field: Secondary | ICD-10-CM

## 2020-01-01 MED ORDER — LEVOCETIRIZINE DIHYDROCHLORIDE 5 MG PO TABS: 5.0000 mg | ORAL_TABLET | Freq: Every evening | ORAL | 3 refills | Status: DC

## 2020-01-01 NOTE — Progress Notes (Signed)
Chief Complaint  Patient presents with  . Follow-up   F/u  1. rll nodule noted prev imaging with ground glass appearance and he is on off smoker of cigs and thc now quit cigs x 6 months smoking thc 2 MVP mild will repeat echo 02/2020  3. Chronic left ankle pain since injury since HS mild he feels he needs insoles  4. Anxiety/panic before test where felt sob and throat closing nothing tried declines tx and also meds to tx for now and psych  Review of Systems  Constitutional: Negative for weight loss.  HENT: Negative for hearing loss.   Eyes: Negative for blurred vision.  Respiratory: Negative for shortness of breath.   Cardiovascular: Negative for chest pain.  Gastrointestinal: Negative for abdominal pain.  Musculoskeletal: Positive for joint pain.  Skin: Negative for rash.  Neurological: Negative for headaches.  Psychiatric/Behavioral: Negative for depression.   Past Medical History:  Diagnosis Date  . Anxiety   . Depression   . Drug abuse ([place])    h/o THC and ectasy use per pt clean since 2017 started drugs in HS  . Headache   . Insomnia   . 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 Kidney stones   . Kidney stones    Dr. Jacqlyn Larsen   . Kidney stones   . Strep throat   . Tick bite    Past Surgical History:  Procedure Laterality Date  . HAND SURGERY Right    2012 right hand has metal plate   . 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 RECONSTRUCTION OF NOSE     2006    Family History  Problem Relation Age of Onset  . Cancer Mother        GU cancer   . Depression Mother   . Hypertension Mother   . Depression Sister   . Kidney disease Sister   . Arthritis Maternal Grandmother   . Cancer Maternal Grandmother        GU cancer   . Alcohol abuse Maternal Grandfather   . Cancer Other        GU cancer    Social History   Socioeconomic History  . Marital status: Married    Spouse name: Not on file  . Number of children: Not on file  .  Years of education: Not on file  . Highest education level: Not on file  Occupational History  . Not on file  Tobacco Use  . Smoking status: Current Every Day Smoker    Packs/day: 0.50    Types: Cigarettes  . Smokeless tobacco: Never Used  Substance and Sexual Activity  . Alcohol use: Yes    Comment: socially  . Drug use: No  . Sexual activity: Yes  Other Topics Concern  . 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 Difficulty of Paying Living Expenses:   Food Insecurity:   . Worried About Charity fundraiser in the Last Year:   . Arboriculturist in the Last Year:   Transportation Needs:   . Film/video editor (Medical):   Marland Kitchen Lack of Transportation (Non-Medical):   Physical Activity:   . Days of Exercise per Week:   . Minutes of Exercise per Session:   Stress:   . Feeling of Stress :   Social Connections:   . Frequency of Communication with Friends and Family:   . Frequency of Social Gatherings with Friends and Family:   . Attends Religious Services:   . Active Member of Clubs or Organizations:   . Attends Archivist Meetings:   Marland Kitchen Marital Status:   Intimate Partner Violence:   . Fear of Current or Ex-Partner:   . Emotionally Abused:   Marland Kitchen Physically Abused:   . Sexually Abused:    No outpatient medications have been marked as taking for the 01/01/20 encounter (Office Visit) with McLean-Scocuzza, Nino Glow, MD.   Allergies  Allergen Reactions  . Lunesta  [Eszopiclone]    No results found for this or any previous visit (from the past 2160 hour(s)). Objective  Body mass index is 20.41 kg/m. Wt Readings from Last 3 Encounters:  01/01/20 138 lb 3.2 oz (62.7 kg)  06/12/19 131 lb 12.8 oz (59.8 kg)  09/24/18 132 lb 12.8 oz (60.2 kg)   Temp Readings from Last 3 Encounters:  01/01/20 (!) 97 F (36.1 C)  06/12/19 97.9 F (36.6 C) (Oral)  09/24/18 97.8 F (36.6 C) (Oral)   BP Readings from Last 3 Encounters:  01/01/20 112/70  06/12/19 (!) 96/54  09/24/18 106/68   Pulse Readings from Last 3 Encounters:  01/01/20 73  06/12/19 (!) 56  09/24/18 (!) 59    Physical Exam Vitals and nursing note reviewed.  Constitutional:      Appearance: Normal appearance.  HENT:     Head: Normocephalic and atraumatic.     Mouth/Throat:     Mouth: Mucous membranes are moist.     Pharynx: Oropharynx is clear.  Eyes:     Conjunctiva/sclera: Conjunctivae normal.     Pupils: Pupils are equal, round, and reactive to light.  Cardiovascular:     Rate and Rhythm: Normal rate and regular rhythm.  Pulmonary:     Effort: Pulmonary effort is normal.     Breath sounds: Normal breath sounds.  Abdominal:     General: Abdomen is flat. Bowel sounds are normal.     Tenderness: There is no abdominal tenderness.  Skin:    General: Skin is warm and dry.  Neurological:     General: No focal deficit present.     Mental Status: He is alert and oriented to person, place, and time. Mental status is at baseline.  Psychiatric:        Mood and Affect: Mood normal.        Behavior: Behavior normal.        Thought Content: Thought content normal.        Judgment: Judgment normal.     Assessment  Plan  Allergic rhinitis, unspecified seasonality, unspecified trigger - Plan: levocetirizine (XYZAL) 5 MG tablet  Ground glass opacity present on imaging of lung - Plan: CT Chest Wo Contrast sch 02/2020   MVP (mitral valve prolapse) - Plan: ECHOCARDIOGRAM COMPLETE sch  02/2020   Panic attack Anxiety Atarax did not  help in the past  Declines meds and therapy  Med and L theanine   Chronic pain of left ankle  Refer [facility]  MH Declines flu  Tdap had 10/01/17  hep B immune MMR immune Declines STD testing  1/2 covid vaccines 2nd dose 01/27/20   rec smoking cessationcongratulated on no cig since 09/2018 but do rec stop THC use  -6 months stopped smoking   -needs repeat echo by 03/30/2020 had 03/31/15 mild valve regurgitation mild MVP repeat echo 3-5 years w/o sxs -as of 06/12/19 declines for now   -still consider CT chest ground glass nodule CT ab/pelvis 10/30/16 in right lower lobe 5 mm  noted on prior imaging -pt as of 06/12/2019 still not ready to sch CT chest  Provider: Dr. Olivia Mackie McLean-Scocuzza-Internal Medicine

## 2020-01-01 NOTE — Telephone Encounter (Signed)
I called pt and left a vm to call and sch CT

## 2020-01-01 NOTE — Patient Instructions (Addendum)
L theanine 100-200 mg daily for stress anxiety  Replika, Bloom, Insight Timer, Calm and Headspace   [facility]   Vitamin D3 4000-5000 IU daily   Mindfulness-Based Stress Reduction Mindfulness-based stress reduction (MBSR) is a program that helps people learn to practice mindfulness. Mindfulness is the practice of intentionally paying attention to the present moment. It can be learned and practiced through techniques such as education, breathing exercises, meditation, and yoga. MBSR includes several mindfulness techniques in one program. MBSR works best when you understand the treatment, are willing to try new things, and can commit to spending time practicing what you learn. MBSR training may include learning about:  How your emotions, thoughts, and reactions affect your body.  New ways to respond to things that cause negative thoughts to start (triggers).  How to notice your thoughts and let go of them.  Practicing awareness of everyday things that you normally do without thinking.  The techniques and goals of different types of meditation. What are the benefits of MBSR? MBSR can have many benefits, which include helping you to:  Develop self-awareness. This refers to knowing and understanding yourself.  Learn skills and attitudes that help you to participate in your own health care.  Learn new ways to care for yourself.  Be more accepting about how things are, and let things go.  Be less judgmental and approach things with an open mind.  Be patient with yourself and trust yourself more. MBSR has also been shown to:  Reduce negative emotions, such as depression and anxiety.  Improve memory and focus.  Change how you sense and approach pain.  Boost your body's ability to fight infections.  Help you connect better with other people.  Improve your sense of well-being. Follow these instructions at home:   Find a local in-person or online MBSR program.  Set  aside some time regularly for mindfulness practice.  Find a mindfulness practice that works best for you. This may include one or more of the following: ? Meditation. Meditation involves focusing your mind on a certain thought or activity. ? Breathing awareness exercises. These help you to stay present by focusing on your breath. ? Body scan. For this practice, you lie down and pay attention to each part of your body from head to toe. You can identify tension and soreness and intentionally relax parts of your body. ? Yoga. Yoga involves stretching and breathing, and it can improve your ability to move and be flexible. It can also provide an experience of testing your body's limits, which can help you release stress. ? Mindful eating. This way of eating involves focusing on the taste, texture, color, and smell of each bite of food. Because this slows down eating and helps you feel full sooner, it can be an important part of a weight-loss plan.  Find a podcast or recording that provides guidance for breathing awareness, body scan, or meditation exercises. You can listen to these any time when you have a free moment to rest without distractions.  Follow your treatment plan as told by your health care provider. This may include taking regular medicines and making changes to your diet or lifestyle as recommended. How to practice mindfulness To do a basic awareness exercise:  Find a comfortable place to sit.  Pay attention to the present moment. Observe your thoughts, feelings, and surroundings just as they are.  Avoid placing judgment on yourself, your feelings, or your surroundings. Make note of any judgment that comes up,  and let it go.  Your mind may wander, and that is okay. Make note of when your thoughts drift, and return your attention to the present moment. To do basic mindfulness meditation:  Find a comfortable place to sit. This may include a stable chair or a firm floor cushion. ? Sit  upright with your back straight. Let your arms fall next to your side with your hands resting on your legs. ? If sitting in a chair, rest your feet flat on the floor. ? If sitting on a cushion, cross your legs in front of you.  Keep your head in a neutral position with your chin dropped slightly. Relax your jaw and rest the tip of your tongue on the roof of your mouth. Drop your gaze to the floor. You can close your eyes if you like.  Breathe normally and pay attention to your breath. Feel the air moving in and out of your nose. Feel your belly expanding and relaxing with each breath.  Your mind may wander, and that is okay. Make note of when your thoughts drift, and return your attention to your breath.  Avoid placing judgment on yourself, your feelings, or your surroundings. Make note of any judgment or feelings that come up, let them go, and bring your attention back to your breath.  When you are ready, lift your gaze or open your eyes. Pay attention to how your body feels after the meditation. Where to find more information You can find more information about MBSR from:  Your health care provider.  Community-based meditation centers or programs.  Programs offered near you. Summary  Mindfulness-based stress reduction (MBSR) is a program that teaches you how to intentionally pay attention to the present moment. It is used with other treatments to help you cope better with daily stress, emotions, and pain.  MBSR focuses on developing self-awareness, which allows you to respond to life stress without judgment or negative emotions.  MBSR programs may involve learning different mindfulness practices, such as breathing exercises, meditation, yoga, body scan, or mindful eating. Find a mindfulness practice that works best for you, and set aside time for it on a regular basis. This information is not intended to replace advice given to you by your health care provider. Make sure you discuss any  questions you have with your health care provider. Document Revised: 09/07/2017 Document Reviewed: 02/01/2017 Else[employer] Patient Education  [employer].  Panic Attack A panic attack is a sudden episode of severe anxiety, fear, or discomfort that causes physical and emotional symptoms. The attack may be in response to something frightening, or it may occur for no known reason. Symptoms of a panic attack can be similar to symptoms of a heart attack or stroke. It is important to see your health care provider when you have a panic attack so that these conditions can be ruled out. A panic attack is a symptom of another condition. Most panic attacks go away with treatment of the underlying problem. If you have panic attacks often, you may have a condition called panic disorder. What are the causes? A panic attack may be caused by:  An extreme, life-threatening situation, such as a war or natural disaster.  An anxiety disorder, such as post-traumatic stress disorder.  Depression.  Certain medical conditions, including heart problems, neurological conditions, and infections.  Certain over-the-counter and prescription medicines.  Illegal drugs that increase heart rate and blood pressure, such as methamphetamine.  Alcohol.  Supplements that increase anxiety.  Panic disorder. What increases the risk? You are more likely to develop this condition if:  You have an anxiety disorder.  You have another mental health condition.  You take certain medicines.  You use alcohol, illegal drugs, or other substances.  You are under extreme stress.  A life event is causing increased feelings of anxiety and depression. What are the signs or symptoms? A panic attack starts suddenly, usually lasts about 20 minutes, and occurs with one or more of the following:  A pounding heart.  A feeling that your heart is beating irregularly or faster than normal (palpitations).  Sweating.  Trembling or  shaking.  Shortness of breath or feeling smothered.  Feeling choked.  Chest pain or discomfort.  Nausea or a strange feeling in your stomach.  Dizziness, feeling lightheaded, or feeling like you might faint.  Chills or hot flashes.  Numbness or tingling in your lips, hands, or feet.  Feeling confused, or feeling that you are not yourself.  Fear of losing control or being emotionally unstable.  Fear of dying. How is this diagnosed? A panic attack is diagnosed with an assessment by your health care provider. During the assessment your health care provider will ask questions about:  Your history of anxiety, depression, and panic attacks.  Your medical history.  Whether you drink alcohol, use illegal drugs, take supplements, or take medicines. Be honest about your substance use. Your health care provider may also:  Order blood tests or other kinds of tests to rule out serious medical conditions.  Refer you to a mental health professional for further evaluation. How is this treated? Treatment depends on the cause of the panic attack:  If the cause is a medical problem, your health care provider will either treat that problem or refer you to a specialist.  If the cause is emotional, you may be given anti-anxiety medicines or referred to a counselor. These medicines may reduce how often attacks happen, reduce how severe the attacks are, and lower anxiety.  If the cause is a medicine, your health care provider may tell you to stop the medicine, change your dose, or take a different medicine.  If the cause is a drug, treatment may involve letting the drug wear off and taking medicine to help the drug leave your body or to counteract its effects. Attacks caused by drug abuse may continue even if you stop using the drug. Follow these instructions at home:  Take over-the-counter and prescription medicines only as told by your health care provider.  If you feel anxious, limit your  caffeine intake.  Take good care of your physical and mental health by: ? Eating a balanced diet that includes plenty of fresh fruits and vegetables, whole grains, lean meats, and low-fat dairy. ? Getting plenty of rest. Try to get 7-8 hours of uninterrupted sleep each night. ? Exercising regularly. Try to get 30 minutes of physical activity at least 5 days a week. ? Not smoking. Talk to your health care provider if you need help quitting. ? Limiting alcohol intake to no more than 1 drink a day for nonpregnant women and 2 drinks a day for men. One drink equals 12 oz of beer, 5 oz of wine, or 1 oz of hard liquor.  Keep all follow-up visits as told by your health care provider. This is important. Panic attacks may have underlying physical or emotional problems that take time to accurately diagnose. Contact a health care provider if:  Your symptoms do not  improve, or they get worse.  You are not able to take your medicine as prescribed because of side effects. Get help right away if:  You have serious thoughts about hurting yourself or others.  You have symptoms of a panic attack. Do not drive yourself to the hospital. Have someone else drive you or call an ambulance. If you ever feel like you may hurt yourself or others, or you have thoughts about taking your own life, get help right away. You can go to your nearest emergency department or call:  Your local emergency services (911 in the [place]  A suicide crisis helpline, such as the National Suicide Prevention Lifeline at 806-742-7227. This is open 24 hours a day. Summary  A panic attack is a sign of a serious health or mental health condition. Get help right away. Do not drive yourself to the hospital. Have someone else drive you or call an ambulance.  Always see a health care provider to have the reasons for the panic attack correctly diagnosed.  If your panic attack was caused by a physical problem, follow your health care  provider's suggestions for medicine, referral to a specialist, and lifestyle changes.  If your panic attack was caused by an emotional problem, follow through with counseling from a qualified mental health specialist.  If you feel like you may hurt yourself or others, call 911 and get help right away. This information is not intended to replace advice given to you by your health care provider. Make sure you discuss any questions you have with your health care provider. Document Revised: 09/07/2017 Document Reviewed: 11/03/2016 Elsevier Patient Education  2020 [employer]  Patellar Tendinitis Rehab Ask your health care provider which exercises are safe for you. Do exercises exactly as told by your health care provider and adjust them as directed. It is normal to feel mild stretching, pulling, tightness, or discomfort as you do these exercises. Stop right away if you feel sudden pain or your pain gets worse. Do not begin these exercises until told by your health care provider. Stretching and range-of-motion exercise This exercise warms up your muscles and joints and improves the movement and flexibility of your knee. The exercise also helps to relieve pain and stiffness. Hamstring, doorway stretch This is an exercise in which you lie in a doorway and prop your leg on a wall to stretch the back of your knee and thigh (hamstring). 1. Lie on your back in front of a doorway with your left / right leg resting against the wall and your other leg flat on the floor in the doorway. There should be a slight bend in your left / right knee. 2. Straighten your left / right knee. You should feel a stretch behind your knee or thigh. If you do not, scoot your buttocks closer to the door. 3. Hold this position for __________ seconds. Repeat __________ times. Complete this exercise __________ times a day. Strengthening exercises These exercises build strength and endurance in your knee. Endurance is the ability  to use your muscles for a long time, even after they get tired. Quadriceps, isometric This exercise stretches the muscles in front of your thigh (quadriceps) without moving your knee joint (isometric). 1. Lie on your back with your left / right leg extended and your other knee bent. 2. Slowly tense the muscles in the front of your left / right thigh. When you do this, you should see your kneecap slide up toward your hip or see increased  dimpling just above the knee. This motion will push the back of your knee toward the floor. If this is painful, try putting a rolled-up hand towel under your knee to support it in a bent position. Change the size of the towel to find a position that allows you to do this exercise without any pain. 3. For __________ seconds, hold the muscle as tight as you can without increasing your pain. 4. Relax the muscles slowly and completely. Repeat __________ times. Complete this exercise __________ times a day. Straight leg raises This exercise stretches the muscles in front of your thigh (quadriceps) and the muscles that move your hips (hip flexors). 1. Lie on your back with your left / right leg extended and your other knee bent. 2. Tense the muscles in the front of your left / right thigh. When you do this, you should see your kneecap slide up or see increased dimpling just above the knee. 3. Keep these muscles tight as you raise your leg 4-6 inches (10-15 cm) off the floor. Do not let your moving knee bend. 4. Hold this position for __________ seconds. 5. Keep these muscles tense as you slowly lower your leg. 6. Relax your muscles slowly and completely. Repeat __________ times. Complete this exercise __________ times a day. Squats This is a weight-bearing exercise in which you bend your knees and lower your hips while engaging your thigh muscles. 1. Stand in front of a table, with your feet and knees pointing straight ahead. You may rest your hands on the table for  balance but not for support. 2. Slowly bend your knees and lower your hips like you are going to sit in a chair. ? Keep your weight over your heels, not over your toes. ? Keep your lower legs upright so they are parallel with the table legs. ? Do not let your hips go lower than your knees. ? Do not bend lower than told by your health care provider. ? If your knee pain increases, do not bend as low. 3. Hold the squat position for __________ seconds. 4. Slowly push with your legs to return to standing. Do not use your hands to pull yourself to standing. Repeat __________ times. Complete this exercise __________ times a day. Step-downs This is an exercise in which you step down slowly while engaging your leg muscles. 1. Stand on the edge of a step. 2. Keeping your weight over your __________ heel, slowly bend your __________ knee to bring your __________ heel toward the floor. Lower your heel as far as you can while keeping control and without increasing any discomfort. ? Do not let your __________ knee come forward. ? Use your leg muscles, not gravity, to lower your body. ? Hold a wall or rail for balance if needed. 3. Slowly push through your heel to lift your body weight back up. 4. Return to the starting position. Repeat __________ times. Complete this exercise __________ times a day. Straight leg raises This exercise strengthens the muscles that rotate the leg at the hip and move it away from your body (hip abductors). 1. Lie on your side with your left / right leg in the top position. Lie so your head, shoulder, knee, and hip line up. You may bend your lower knee to help you keep your balance. 2. Roll your hips slightly forward, so that your hips are stacked directly over each other and your left / right knee is facing forward. 3. Leading with your heel, lift your  top leg 4-6 inches (10-15 cm). You should feel the muscles in your outer hip lifting. ? Do not let your foot drift  forward. ? Do not let your knee roll toward the ceiling. 4. Hold this position for __________ seconds. 5. Slowly lower your leg to the starting position. 6. Let your muscles relax completely after each repetition. Repeat __________ times. Complete this exercise __________ times a day. This information is not intended to replace advice given to you by your health care provider. Make sure you discuss any questions you have with your health care provider. Document Revised: 01/16/2019 Document Reviewed: 07/16/2018 Elsevier Patient Education  2020 [employer]  Patellar Tendon Tear Rehab Ask your health care provider which exercises are safe for you. Do exercises exactly as told by your health care provider and adjust them as directed. It is normal to feel mild stretching, pulling, tightness, or discomfort as you do these exercises. Stop right away if you feel sudden pain or your pain gets worse. Do not begin these exercises until told by your health care provider. Stretching and range-of-motion exercise This exercise warms up your muscles and joints and improves the movement and flexibility of your knee. The exercise also helps to relieve pain and stiffness. Knee flexion This is an exercise in which you stretch (flex) your knee by lying on your back and sliding your heel toward your buttocks. 4. Lie on your back with both legs straight. If this causes back discomfort, bend your healthy knee so your foot is flat on the floor. 5. Slowly slide your left / right heel back toward your buttocks. Stop when you feel a gentle stretch in the front of your knee or thigh, or when your knee is at a __________ degree angle. 6. Hold this position for __________ seconds. 7. Slowly slide your left / right heel back to the starting position. Repeat __________ times. Complete this exercise __________ times a day. Strengthening exercises These exercises build strength and endurance in your knee. Endurance is the  ability to use your muscles for a long time, even after they get tired. Quadriceps, isometric This exercise stretches the muscles in front of your thigh (quadriceps) without moving your knee joint (isometric). 5. Lie on your back with your left / right leg extended and your other knee bent. 6. Slowly tense the muscles in the front of your left / right thigh. When you do this, you should see your kneecap slide up toward your hip or see increased dimpling just above the knee. This motion will push the back of the knee down toward the floor. 7. For __________ seconds, hold the muscle as tight as you can without increasing your pain. 8. Relax the muscles slowly and completely. Repeat __________ times. Complete this exercise __________ times a day. Straight leg raises This exercise stretches the muscles in front of your thigh (quadriceps) and the muscles that move your hips (hip flexors). 7. Lie on your back with your left / right leg extended and your other knee bent. 8. Slowly tense the muscles in the front of your left / right thigh. When you do this, you should see your kneecap slide up or see increased dimpling just above your knee. 9. Keep these muscles tight as you raise your leg 4-6 inches (10-15 cm) off the floor. Do not let your knee bend. 10. Hold this position for __________ seconds. 11. Keep these muscles tense as you lower your leg. 12. Relax your muscles slowly and completely. Repeat  __________ times. Complete this exercise __________ times a day. Straight leg raises This exercise strengthens the muscles that rotate the leg at the hip and move it away from your body (hip abductors). 5. Lie on your side with your left / right leg in the top position. Lie so your head, shoulder, knee, and hip line up. You may bend your bottom knee to help you keep your balance. 6. Lift your top leg 4-6 inches (10-15 cm) while keeping your toes pointed straight ahead. 7. Hold this position for __________  seconds. 8. Slowly lower your leg to the starting position. 9. Allow your muscles to relax completely. Repeat __________ times. Complete this exercise __________ times a day. Straight leg raises extensors This exercise stretches the muscles that move your hips away from the front of the pelvis (hip extensors). 5. Lie on your abdomen on a firm surface. 6. Tense the muscles in your buttocks and lift your left / right leg about 4 inches (10 cm). Keep your knee straight as you lift your leg. If you cannot lift your leg that high without arching your back, place a pillow under your hips. 7. Hold this position for __________ seconds. 8. Slowly lower your leg to the starting position. 9. Allow your muscles to relax completely. Repeat __________ times. Complete this exercise __________ times a day. This information is not intended to replace advice given to you by your health care provider. Make sure you discuss any questions you have with your health care provider. Document Revised: 01/16/2019 Document Reviewed: 07/16/2018 Elsevier Patient Education  2020 [employer].

## 2020-01-06 ENCOUNTER — Other Ambulatory Visit: Payer: Self-pay | Admitting: Podiatry

## 2020-01-06 ENCOUNTER — Ambulatory Visit (INDEPENDENT_AMBULATORY_CARE_PROVIDER_SITE_OTHER): Payer: BC Managed Care – PPO | Admitting: Podiatry

## 2020-01-06 ENCOUNTER — Ambulatory Visit (INDEPENDENT_AMBULATORY_CARE_PROVIDER_SITE_OTHER): Payer: BC Managed Care – PPO

## 2020-01-06 ENCOUNTER — Other Ambulatory Visit: Payer: Self-pay

## 2020-01-06 ENCOUNTER — Ambulatory Visit: Payer: BC Managed Care – PPO | Admitting: Podiatry

## 2020-01-06 ENCOUNTER — Encounter: Payer: Self-pay | Admitting: Podiatry

## 2020-01-06 DIAGNOSIS — M2142 Flat foot [pes planus] (acquired), left foot: Secondary | ICD-10-CM

## 2020-01-06 DIAGNOSIS — M2141 Flat foot [pes planus] (acquired), right foot: Secondary | ICD-10-CM

## 2020-01-06 DIAGNOSIS — M25572 Pain in left ankle and joints of left foot: Secondary | ICD-10-CM

## 2020-01-06 DIAGNOSIS — M79672 Pain in left foot: Secondary | ICD-10-CM | POA: Diagnosis not present

## 2020-01-06 NOTE — Progress Notes (Signed)
Subjective:  Patient ID: David [name], male    DOB: Jun 05, 1991,  MRN: [id number]  Chief Complaint  Patient presents with  . Ankle Pain    pt is here for pain of the left ankle, pt states that the left ankle is painful when putting pressure on it. Going on for 2-3 years, pt has tried stretching exercises but not much has helped.    29 y.o. male presents with the above complaint.  Patient presents with left foot and ankle pain the pain is primarily located at the heel as well as the arch of the pain.  Patient states is going on for 2 to 3 years.  The pain is on both sides however the left side is much more painful.  There is tenderness associated the left ankle arch as well.  Patient states the ankle often rolls inward given her flexible and pes planus patient has.  Patient would like to discuss orthotics management as well.  He denies any other acute complaints.  He states he gets generalized aches to the left lower extremity likely due to underlying flatfoot.  He has not seen anyone else for this.  He denies any other treatment options   Review of Systems: Negative except as noted in the HPI. Denies N/V/F/Ch.  Past Medical History:  Diagnosis Date  . Anxiety   . Depression   . 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 Headache   . Insomnia   . Kidney papillary necrosis (HCC)   . Kidney stones   . Kidney stones    Dr. Achilles Dunk   . Kidney stones   . Strep throat   . Tick bite     Current Outpatient Medications:  .  levocetirizine (XYZAL) 5 MG tablet, Take 1 tablet (5 mg total) by mouth every evening., Disp: 90 tablet, Rfl: 3  Social History   Tobacco Use  Smoking Status Current Every Day Smoker  . Packs/day: 0.50  . Types: Cigarettes  Smokeless Tobacco Never Used    Allergies  Allergen Reactions  . Lunesta [Eszopiclone]    Objective:  There were no vitals filed for this visit. There is no height or weight on file to  calculate BMI. Constitutional Well developed. Well nourished.  Vascular Dorsalis pedis pulses palpable bilaterally. Posterior tibial pulses palpable bilaterally. Capillary refill normal to all digits.  No cyanosis or clubbing noted. Pedal hair growth normal.  Neurologic Normal speech. Oriented to person, place, and time. Epicritic sensation to light touch grossly present bilaterally.  Dermatologic Nails well groomed and normal in appearance. No open wounds. No skin lesions.  Orthopedic:  Pain on palpation into the arch of the foot at the central band of the fascia.  Generalized lower extremity pain noted to the posterior anterior and lateral compartment of the leg.  Able to recreate the arch with dorsiflexion of the hallux.  Upon weightbearing complete collapse of the arch noted with calcaneovalgus too many toe sign.   Radiographs: 3 views of skeletally mature adult left foot: There is decreasing calcaneal inclination angle increase in talar declination angle no first ray elevatus noted.  Anterior break in the cyma line noted.  No degenerative joint disease noted.  No other bony abnormalities identified Assessment:   1. Arch pain, left   2. Pes planus of both feet    Plan:  Patient was evaluated and treated and all questions answered.  Left flexible  pes planus -I explained to the patient the etiology of pes planus deformity and various treatment options were extensively discussed.  I believe patient will benefit from custom-made orthotics given the flexibility of the pes planus deformity as well as arch pain from not enough support. -He will be scheduled to see Liliane Channel for custom-made orthotics to help control the hindfoot motion as well as support the arch of the foot.  Return for See Liliane Channel for orthotics ASAP.

## 2020-01-08 ENCOUNTER — Telehealth: Payer: Self-pay | Admitting: Internal Medicine

## 2020-01-08 NOTE — Telephone Encounter (Signed)
I called pt and left vm to call ofc to sch CT.  

## 2020-01-20 ENCOUNTER — Ambulatory Visit (INDEPENDENT_AMBULATORY_CARE_PROVIDER_SITE_OTHER): Payer: BC Managed Care – PPO | Admitting: Orthotics

## 2020-01-20 ENCOUNTER — Other Ambulatory Visit: Payer: Self-pay

## 2020-01-20 DIAGNOSIS — M2142 Flat foot [pes planus] (acquired), left foot: Secondary | ICD-10-CM | POA: Diagnosis not present

## 2020-01-20 DIAGNOSIS — M2141 Flat foot [pes planus] (acquired), right foot: Secondary | ICD-10-CM

## 2020-01-20 DIAGNOSIS — M79672 Pain in left foot: Secondary | ICD-10-CM

## 2020-01-20 NOTE — Progress Notes (Signed)
Patient presents with history of PTTD/Pes Planus (acquired).  Patient demonstrated medial shift talus/drop navicular, and medial column collapse.   Plan is for CMFO w/ longitudinal arch support, rear foot stability to address eversion.  Fabrication will be with semi rigid/rigid poly pro shell, deep heel seat, wide, padding under spenco cover. Plan on                                      To fabricate. 

## 2020-01-31 ENCOUNTER — Telehealth (INDEPENDENT_AMBULATORY_CARE_PROVIDER_SITE_OTHER): Payer: BC Managed Care – PPO | Admitting: Family Medicine

## 2020-01-31 ENCOUNTER — Other Ambulatory Visit: Payer: Self-pay

## 2020-01-31 ENCOUNTER — Encounter: Payer: Self-pay | Admitting: Family Medicine

## 2020-01-31 VITALS — Ht 69.0 in | Wt 138.0 lb

## 2020-01-31 DIAGNOSIS — N200 Calculus of kidney: Secondary | ICD-10-CM

## 2020-01-31 DIAGNOSIS — R310 Gross hematuria: Secondary | ICD-10-CM

## 2020-01-31 MED ORDER — TAMSULOSIN HCL 0.4 MG PO CAPS: 0.4000 mg | ORAL_CAPSULE | Freq: Every day | ORAL | 0 refills | Status: AC

## 2020-01-31 MED ORDER — ONDANSETRON HCL 4 MG PO TABS: 4.0000 mg | ORAL_TABLET | Freq: Three times a day (TID) | ORAL | 0 refills | Status: AC | PRN

## 2020-01-31 MED ORDER — HYDROCODONE-ACETAMINOPHEN 5-325 MG PO TABS: 1.0000 | ORAL_TABLET | Freq: Three times a day (TID) | ORAL | 0 refills | Status: AC | PRN

## 2020-01-31 NOTE — Progress Notes (Signed)
Virtual Visit via Video Note   I connected with David [name] on 01/31/20 by a video enabled telemedicine application and verified that I am speaking with the correct person using two identifiers.  Location patient: home Location provider:work office Persons participating in the virtual visit: patient, provider  I discussed the limitations of evaluation and management by telemedicine and the availability of in person appointments. The patient expressed understanding and agreed to proceed.  Chief Complaint  Patient presents with  . Flank Pain    x 1 day with blood in urine   . Hematuria    x 1 day     HPI: David [name] is a 29 yo male with hx of kidney stones.  He started having some right flank pain yesterday with hematuria. Pain level yesterday was 7/10 but today is 4/10. Pain is described as a sharp/dull constant pain with intermittent sharp stabbing pain.   He has not identified exacerbating or alleviating factors. + Mild nausea , no vomiting.  He is not able to check his temperature.  He has taken otc tylenol for tooth pain for the last few days.  Negative for changes in bowel habits. He has not noted dysuria, decreased urine output, or difficulty starting urination.  He is not sure about chills. Yesterday pain was constant but today is intermittent. Still having hematuria.  Former smoker. He is planning on establishing with urologist.  Last episode of kidney stone about 2 years ago. Symptoms have been treated with hydrocodone and Zofran in the past.  02/04/2015 abdomen/pelvics CT: Multiple nonobstructing calculi are identified in both kidneys. There are a few more small calculi present bilaterally compared to prior study from 2011. Areas of scarring and caliceal dilatation remain without change compared to the previous study. There is no frank hydronephrosis. No ureteral calculi are identified. No bowel obstruction.  No abscess.  Appendix appears normal.  ROS: See pertinent  positives and negatives per HPI.  Past Medical History:  Diagnosis Date  . Anxiety   . Depression   . 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 Headache   . Insomnia   . Kidney papillary necrosis ([place])   . Kidney stones   . Kidney stones    Dr. Jacqlyn Larsen   . Kidney stones   . Strep throat   . Tick bite     Past Surgical History:  Procedure Laterality Date  . HAND SURGERY Right    2012 right hand has metal plate   . 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 RECONSTRUCTION OF NOSE     2006     Family History  Problem Relation Age of Onset  . Cancer Mother        GU cancer   . Depression Mother   . Hypertension Mother   . Depression Sister   . Kidney disease Sister   . Arthritis Maternal Grandmother   . Cancer Maternal Grandmother        GU cancer   . Alcohol abuse Maternal Grandfather   . Cancer Other        GU cancer     Social History   Socioeconomic History  . Marital status: Married    Spouse name: Not on file  . Number of children: Not on file  . Years of education: Not on file  . Highest education level: Not on  file  Occupational History  . Not on file  Tobacco Use  . Smoking status: Former Smoker    Packs/day: 0.50    Types: Cigarettes    Quit date: 12/31/2019    Years since quitting: 0.0  . Smokeless tobacco: Never Used  Substance and Sexual Activity  . Alcohol use: Yes    Comment: socially  . Drug use: No  . Sexual activity: Yes  Other Topics Concern  . 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 Difficulty of Paying Living Expenses:   Food Insecurity:   . Worried About Programme researcher, broadcasting/film/video in the Last Year:   . Barista in the Last Year:   Transportation Needs:   . Freight forwarder (Medical):   Marland Kitchen Lack of Transportation (Non-Medical):   Physical Activity:   . Days of Exercise per Week:   . Minutes of Exercise per Session:   Stress:   . Feeling of Stress :   Social Connections:   . Frequency of Communication with Friends and Family:   . Frequency of Social Gatherings with Friends and Family:   . Attends Religious Services:   . Active Member of Clubs or Organizations:   . Attends Banker Meetings:   Marland Kitchen Marital Status:   Intimate Partner Violence:   . Fear of Current or Ex-Partner:   . Emotionally Abused:   Marland Kitchen Physically Abused:   . Sexually Abused:     Current Outpatient Medications:  .  levocetirizine (XYZAL) 5 MG tablet, Take 1 tablet (5 mg total) by mouth every evening., Disp: 90 tablet, Rfl: 3 .  HYDROcodone-acetaminophen (NORCO/VICODIN) 5-325 MG tablet, Take 1 tablet by mouth every 8 (eight) hours as needed for up to 3 days for moderate pain., Disp: 9 tablet, Rfl: 0 .  ondansetron (ZOFRAN) 4 MG tablet, Take 1 tablet (4 mg total) by mouth every 8 (eight) hours as needed for up to 3 days for nausea or vomiting., Disp: 9 tablet, Rfl: 0 .  tamsulosin (FLOMAX) 0.4 MG CAPS capsule, Take 1 capsule (0.4 mg total) by mouth daily for 7 days., Disp: 7 capsule, Rfl: 0  EXAM:  VITALS per patient if applicable:Ht 5\' 9"  (1.753 m)   Wt 138 lb (62.6 kg)   BMI 20.38 kg/m   GENERAL: alert, oriented, appears well and in no acute distress  HEENT: atraumatic, conjunctiva clear, no obvious abnormalities on inspection.  NECK: normal movements of the head and neck  LUNGS: on inspection no signs of respiratory distress,  breathing rate appears normal, no obvious gross SOB, gasping or wheezing  CV: no obvious cyanosis  MS: moves all visible extremities without noticeable abnormality  PSYCH/NEURO: pleasant and cooperative, no obvious depression or anxiety, speech and thought processing grossly intact  ASSESSMENT AND PLAN:  Discussed the following assessment and plan:  Gross hematuria We  discussed possible etiologies, most likely nephrolithiasis. No symptoms that suggest an infectious process. He was clearly instructed about warning signs.  Nephrolithiasis - Plan: HYDROcodone-acetaminophen (NORCO/VICODIN) 5-325 MG tablet, ondansetron (ZOFRAN) 4 MG tablet, tamsulosin (FLOMAX) 0.4 MG CAPS capsule Recommend increasing fluid intake. Symptomatic treatment with hydrocodone-acetaminophen for pain and Zofran. In the past he has taking Flomax, it has been well-tolerated. If possible he could get a urine strainer.  He was clearly instructed about warning signs. He needs to follow-up with PCP in 5 to 7 days if symptoms have not resolved.    I discussed the assessment and treatment plan with the patient. Dillan was provided an opportunity to ask questions and all were answered. He agreed with the plan and demonstrated an understanding of the instructions. [place] controlled subs report reviewed.   The patient was advised to go to the ER if the symptoms worsen or to see pcp if the condition fails to improve as anticipated.  Return in about 1 week (around 02/07/2020), or if symptoms worsen or fail to improve.   Betty Swaz[place], MD

## 2020-02-02 ENCOUNTER — Telehealth: Payer: Self-pay

## 2020-02-02 NOTE — Telephone Encounter (Signed)
[facility] [place] Night - Cl TELEPHONE ADVICE RECORD [facility] Patient Name: David [name] Gender: Male DOB: 1990/12/23 Age: 29 Y 11 M 20 D Return Phone Number: [phone] (Primary) Address: City/State/Zip: [place] [address] Client [facility] [place] [facility] Night - Cl Client Site [facility] [place] [facility] - Night Physician McClean-Scocuzza, French Ana Contact Type Call Who Is Calling Patient / Member / Family / Caregiver Call Type Triage / Clinical Relationship To Patient Self Return Phone Number [phone] (Primary) Chief Complaint Urine, Blood In Reason for Call Symptomatic / Request for Health Information Initial Comment Caller states that he as called 3 times and has been given numbers for a virtual visit none have worked. He has blood in his urine Translation No Nurse Assessment Nurse: Loletta Specter, RN, Misty Stanley Date/Time Lamount Cohen Time): 01/31/2020 10:50:08 AM Confirm and document reason for call. If symptomatic, describe symptoms. ---Caller previously given numbers that are not working. Connected to [facility] for virtual appointment at [phone]. Has the patient had close contact with a person known or suspected to have the novel coronavirus illness OR traveled / lives in area with major community spread (including international travel) in the last 14 days from the onset of symptoms? * If Asymptomatic, screen for exposure and travel within the last 14 days. ---No Does the patient have any new or worsening symptoms? ---No Guidelines Guideline Title Affirmed Question Affirmed Notes Nurse Date/Time (Eastern Time) Disp. Time Lamount Cohen Time) Disposition Final User 01/31/2020 10:44:30 AM Send To RN Personal Earlene Plater, RN, Candace 01/31/2020 10:52:29 AM Clinical Call Yes Loletta Specter, RN, Misty Stanley

## 2020-02-02 NOTE — Telephone Encounter (Signed)
See Note from 01/31/20 patient has had virtual visit.

## 2020-02-03 NOTE — Telephone Encounter (Signed)
Patient stated he is not having any more sx. Patient stated he will hold off on tests and appointment here. He has appointment in [facility] to nephrology. He stated he will call next week after appointment to inform Dr French Ana.

## 2020-02-03 NOTE — Telephone Encounter (Signed)
Can he go to [facility] and leave a urine sample?  Closest location near him ?   Also does he want to see urology for this?   Could be infection or kidney stones vs other

## 2020-02-10 ENCOUNTER — Other Ambulatory Visit: Payer: Self-pay

## 2020-02-10 ENCOUNTER — Ambulatory Visit: Payer: BC Managed Care – PPO | Admitting: Orthotics

## 2020-02-10 DIAGNOSIS — M7651 Patellar tendinitis, right knee: Secondary | ICD-10-CM

## 2020-02-10 DIAGNOSIS — M25572 Pain in left ankle and joints of left foot: Secondary | ICD-10-CM

## 2020-02-10 DIAGNOSIS — M2141 Flat foot [pes planus] (acquired), right foot: Secondary | ICD-10-CM

## 2020-02-10 DIAGNOSIS — G8929 Other chronic pain: Secondary | ICD-10-CM

## 2020-02-10 NOTE — Progress Notes (Signed)
Need to send back as topcover too short.  Need 10.5

## 2020-02-16 ENCOUNTER — Other Ambulatory Visit: Payer: Self-pay

## 2020-02-16 ENCOUNTER — Ambulatory Visit
Admission: RE | Admit: 2020-02-16 | Discharge: 2020-02-16 | Disposition: A | Discharge status: Home / Self Care | Payer: BC Managed Care – PPO | Source: Ambulatory Visit | Attending: Internal Medicine | Admitting: Internal Medicine

## 2020-02-16 ENCOUNTER — Other Ambulatory Visit: Payer: Self-pay | Admitting: Orthotics

## 2020-02-16 DIAGNOSIS — R918 Other nonspecific abnormal finding of lung field: Secondary | ICD-10-CM | POA: Diagnosis not present

## 2020-02-16 DIAGNOSIS — R911 Solitary pulmonary nodule: Secondary | ICD-10-CM | POA: Diagnosis not present

## 2020-02-18 DIAGNOSIS — N23 Unspecified renal colic: Secondary | ICD-10-CM | POA: Diagnosis not present

## 2020-02-28 DIAGNOSIS — N132 Hydronephrosis with renal and ureteral calculous obstruction: Secondary | ICD-10-CM | POA: Diagnosis not present

## 2020-02-28 DIAGNOSIS — N23 Unspecified renal colic: Secondary | ICD-10-CM | POA: Diagnosis not present

## 2020-03-03 ENCOUNTER — Telehealth: Payer: Self-pay | Admitting: Podiatry

## 2020-03-03 NOTE — Telephone Encounter (Signed)
Pt left message stating the orthotics he picked up and tried to wear and the right one heel is to narrow and hurting. The left orthotic feels comfortable.  I returned call and left message that pt would need an appt with Raiford Noble in [place] office or [place] office to see if it can be adjusted in office or if it needs to be sent back.

## 2020-03-18 ENCOUNTER — Ambulatory Visit: Payer: BC Managed Care – PPO | Admitting: Orthotics

## 2020-03-18 ENCOUNTER — Other Ambulatory Visit: Payer: Self-pay

## 2020-03-18 DIAGNOSIS — M2141 Flat foot [pes planus] (acquired), right foot: Secondary | ICD-10-CM

## 2020-03-18 DIAGNOSIS — M2142 Flat foot [pes planus] (acquired), left foot: Secondary | ICD-10-CM

## 2020-03-18 DIAGNOSIS — G8929 Other chronic pain: Secondary | ICD-10-CM

## 2020-03-18 DIAGNOSIS — M7651 Patellar tendinitis, right knee: Secondary | ICD-10-CM

## 2020-03-18 NOTE — Progress Notes (Signed)
Sending f/o back to have RF adjusted.  R is too narrow; needs to match left.

## 2020-03-31 DIAGNOSIS — N1330 Unspecified hydronephrosis: Secondary | ICD-10-CM | POA: Diagnosis not present

## 2020-03-31 DIAGNOSIS — N202 Calculus of kidney with calculus of ureter: Secondary | ICD-10-CM | POA: Diagnosis not present

## 2020-03-31 DIAGNOSIS — N23 Unspecified renal colic: Secondary | ICD-10-CM | POA: Diagnosis not present

## 2020-04-05 ENCOUNTER — Telehealth: Payer: Self-pay | Admitting: Podiatry

## 2020-04-05 NOTE — Telephone Encounter (Signed)
Pt left message stating the right orthotic is still to narrow and causing some pain and is also to short for the new shoes he just purchased.   I returned call and left a message for pt to call to schedule an appt with Raiford Noble.

## 2020-04-27 ENCOUNTER — Other Ambulatory Visit: Payer: Self-pay

## 2020-04-27 ENCOUNTER — Other Ambulatory Visit (INDEPENDENT_AMBULATORY_CARE_PROVIDER_SITE_OTHER): Payer: BC Managed Care – PPO

## 2020-04-27 DIAGNOSIS — Z1329 Encounter for screening for other suspected endocrine disorder: Secondary | ICD-10-CM | POA: Diagnosis not present

## 2020-04-27 DIAGNOSIS — D751 Secondary polycythemia: Secondary | ICD-10-CM | POA: Diagnosis not present

## 2020-04-27 DIAGNOSIS — Z13220 Encounter for screening for lipoid disorders: Secondary | ICD-10-CM | POA: Diagnosis not present

## 2020-04-27 DIAGNOSIS — Z0000 Encounter for general adult medical examination without abnormal findings: Secondary | ICD-10-CM

## 2020-04-27 LAB — LIPID PANEL
Cholesterol: 157 mg/dL (ref 0–200)
HDL: 49 mg/dL
LDL Cholesterol: 91 mg/dL (ref 0–99)
NonHDL: 107.67
Total CHOL/HDL Ratio: 3
Triglycerides: 84 mg/dL (ref 0.0–149.0)
VLDL: 16.8 mg/dL (ref 0.0–40.0)

## 2020-04-27 LAB — TSH: TSH: 1.6 u[IU]/mL (ref 0.35–4.50)

## 2020-04-27 LAB — COMPREHENSIVE METABOLIC PANEL
ALT: 12 U/L (ref 0–53)
AST: 17 U/L (ref 0–37)
Albumin: 5 g/dL (ref 3.5–5.2)
Alkaline Phosphatase: 61 U/L (ref 39–117)
BUN: 13 mg/dL (ref 6–23)
CO2: 29 mEq/L (ref 19–32)
Calcium: 10.4 mg/dL (ref 8.4–10.5)
Chloride: 101 mEq/L (ref 96–112)
Creatinine, Ser: 0.9 mg/dL (ref 0.40–1.50)
GFR: 99.62 mL/min (ref >60.00)
Glucose, Bld: 87 mg/dL (ref 70–99)
Potassium: 3.9 mEq/L (ref 3.5–5.1)
Sodium: 140 mEq/L (ref 135–145)
Total Bilirubin: 1.3 mg/dL — ABNORMAL HIGH (ref 0.2–1.2)
Total Protein: 7.7 g/dL (ref 6.0–8.3)

## 2020-04-27 LAB — CBC WITH DIFFERENTIAL/PLATELET
Basophils Absolute: 0.1 10*3/uL (ref 0.0–0.1)
Basophils Relative: 1.2 % (ref 0.0–3.0)
Eosinophils Absolute: 0.5 10*3/uL (ref 0.0–0.7)
Eosinophils Relative: 7.8 % — ABNORMAL HIGH (ref 0.0–5.0)
HCT: 50 % (ref 39.0–52.0)
Hemoglobin: 17.4 g/dL — ABNORMAL HIGH (ref 13.0–17.0)
Lymphocytes Relative: 39 % (ref 12.0–46.0)
Lymphs Abs: 2.3 10*3/uL (ref 0.7–4.0)
MCHC: 34.8 g/dL (ref 30.0–36.0)
MCV: 91.9 fl (ref 78.0–100.0)
Monocytes Absolute: 0.4 10*3/uL (ref 0.1–1.0)
Monocytes Relative: 6.6 % (ref 3.0–12.0)
Neutro Abs: 2.7 10*3/uL (ref 1.4–7.7)
Neutrophils Relative %: 45.4 % (ref 43.0–77.0)
Platelets: 240 10*3/uL (ref 150.0–400.0)
RBC: 5.44 Mil/uL (ref 4.22–5.81)
RDW: 13 % (ref 11.5–15.5)
WBC: 6 10*3/uL (ref 4.0–10.5)

## 2020-04-28 LAB — URINALYSIS, ROUTINE W REFLEX MICROSCOPIC
Bacteria, UA: NONE SEEN /HPF
Bilirubin Urine: NEGATIVE
Glucose, UA: NEGATIVE
Hgb urine dipstick: NEGATIVE
Hyaline Cast: NONE SEEN /LPF
Ketones, ur: NEGATIVE
Nitrite: NEGATIVE
Protein, ur: NEGATIVE
RBC / HPF: NONE SEEN /HPF (ref 0–2)
Specific Gravity, Urine: 1.007 (ref 1.001–1.03)
Squamous Epithelial / HPF: NONE SEEN /HPF (ref <=5)
WBC, UA: NONE SEEN /HPF (ref 0–5)
pH: 8 (ref 5.0–8.0)

## 2020-04-29 ENCOUNTER — Other Ambulatory Visit: Payer: Self-pay

## 2020-04-29 ENCOUNTER — Encounter: Payer: Self-pay | Admitting: Internal Medicine

## 2020-04-29 ENCOUNTER — Ambulatory Visit: Payer: BC Managed Care – PPO | Admitting: Internal Medicine

## 2020-04-29 VITALS — BP 100/70 | HR 73 | Temp 97.5°F | Ht 69.0 in | Wt 135.8 lb

## 2020-04-29 DIAGNOSIS — D751 Secondary polycythemia: Secondary | ICD-10-CM

## 2020-04-29 DIAGNOSIS — E559 Vitamin D deficiency, unspecified: Secondary | ICD-10-CM | POA: Diagnosis not present

## 2020-04-29 DIAGNOSIS — N200 Calculus of kidney: Secondary | ICD-10-CM | POA: Insufficient documentation

## 2020-04-29 DIAGNOSIS — R911 Solitary pulmonary nodule: Secondary | ICD-10-CM | POA: Diagnosis not present

## 2020-04-29 DIAGNOSIS — J309 Allergic rhinitis, unspecified: Secondary | ICD-10-CM | POA: Diagnosis not present

## 2020-04-29 DIAGNOSIS — IMO0001 Reserved for inherently not codable concepts without codable children: Secondary | ICD-10-CM

## 2020-04-29 DIAGNOSIS — Z13818 Encounter for screening for other digestive system disorders: Secondary | ICD-10-CM

## 2020-04-29 DIAGNOSIS — Z1329 Encounter for screening for other suspected endocrine disorder: Secondary | ICD-10-CM

## 2020-04-29 DIAGNOSIS — I341 Nonrheumatic mitral (valve) prolapse: Secondary | ICD-10-CM

## 2020-04-29 DIAGNOSIS — Z0000 Encounter for general adult medical examination without abnormal findings: Secondary | ICD-10-CM

## 2020-04-29 LAB — ERYTHROPOIETIN: Erythropoietin: 9.3 m[IU]/mL (ref 2.6–18.5)

## 2020-04-29 LAB — IRON,TIBC AND FERRITIN PANEL
Ferritin: 82 ng/mL (ref 38–380)
Iron: 122 ug/dL (ref 50–195)

## 2020-04-29 MED ORDER — LEVOCETIRIZINE DIHYDROCHLORIDE 5 MG PO TABS: 5.0000 mg | ORAL_TABLET | Freq: Every evening | ORAL | 3 refills | Status: DC

## 2020-04-29 NOTE — Patient Instructions (Addendum)
Consider echo and let me know when ready in the future  CT scan lungs in 1 year  Total 4000 IU to 5000 IU daily     Dietary Guidelines to Help Prevent Kidney Stones Kidney stones are deposits of minerals and salts that form inside your kidneys. Your risk of developing kidney stones may be greater depending on your diet, your lifestyle, the medicines you take, and whether you have certain medical conditions. Most people can reduce their chances of developing kidney stones by following the instructions below. Depending on your overall health and the type of kidney stones you tend to develop, your dietitian may give you more specific instructions. What are tips for following this plan? Reading food labels  Choose foods with "no salt added" or "low-salt" labels. Limit your sodium intake to less than 1500 mg per day.  Choose foods with calcium for each meal and snack. Try to eat about 300 mg of calcium at each meal. Foods that contain 200-500 mg of calcium per serving include: ? 8 oz (237 ml) of milk, fortified nondairy milk, and fortified fruit juice. ? 8 oz (237 ml) of kefir, yogurt, and soy yogurt. ? 4 oz (118 ml) of tofu. ? 1 oz of cheese. ? 1 cup (300 g) of dried figs. ? 1 cup (91 g) of cooked broccoli. ? 1-3 oz can of sardines or mackerel.  Most people need 1000 to 1500 mg of calcium each day. Talk to your dietitian about how much calcium is recommended for you. Shopping  Buy plenty of fresh fruits and vegetables. Most people do not need to avoid fruits and vegetables, even if they contain nutrients that may contribute to kidney stones.  When shopping for convenience foods, choose: ? Whole pieces of fruit. ? Premade salads with dressing on the side. ? Low-fat fruit and yogurt smoothies.  Avoid buying frozen meals or prepared deli foods.  Look for foods with live cultures, such as yogurt and kefir. Cooking  Do not add salt to food when cooking. Place a salt shaker on the table  and allow each person to add his or her own salt to taste.  Use vegetable protein, such as beans, textured vegetable protein (TVP), or tofu instead of meat in pasta, casseroles, and soups. Meal planning   Eat less salt, if told by your dietitian. To do this: ? Avoid eating processed or premade food. ? Avoid eating fast food.  Eat less animal protein, including cheese, meat, poultry, or fish, if told by your dietitian. To do this: ? Limit the number of times you have meat, poultry, fish, or cheese each week. Eat a diet free of meat at least 2 days a week. ? Eat only one serving each day of meat, poultry, fish, or seafood. ? When you prepare animal protein, cut pieces into small portion sizes. For most meat and fish, one serving is about the size of one deck of cards.  Eat at least 5 servings of fresh fruits and vegetables each day. To do this: ? Keep fruits and vegetables on hand for snacks. ? Eat 1 piece of fruit or a handful of berries with breakfast. ? Have a salad and fruit at lunch. ? Have two kinds of vegetables at dinner.  Limit foods that are high in a substance called oxalate. These include: ? Spinach. ? Rhubarb. ? Beets. ? Potato chips and french fries. ? Nuts.  If you regularly take a diuretic medicine, make sure to eat at least 1-2  fruits or vegetables high in potassium each day. These include: ? Avocado. ? Banana. ? Orange, prune, carrot, or tomato juice. ? Baked potato. ? Cabbage. ? Beans and split peas. General instructions   Drink enough fluid to keep your urine clear or pale yellow. This is the most important thing you can do.  Talk to your health care provider and dietitian about taking daily supplements. Depending on your health and the cause of your kidney stones, you may be advised: ? Not to take supplements with vitamin C. ? To take a calcium supplement. ? To take a daily probiotic supplement. ? To take other supplements such as magnesium, fish oil, or  vitamin B6.  Take all medicines and supplements as told by your health care provider.  Limit alcohol intake to no more than 1 drink a day for nonpregnant women and 2 drinks a day for men. One drink equals 12 oz of beer, 5 oz of wine, or 1 oz of hard liquor.  Lose weight if told by your health care provider. Work with your dietitian to find strategies and an eating plan that works best for you. What foods are not recommended? Limit your intake of the following foods, or as told by your dietitian. Talk to your dietitian about specific foods you should avoid based on the type of kidney stones and your overall health. Grains Breads. Bagels. Rolls. Baked goods. Salted crackers. Cereal. Pasta. Vegetables Spinach. Rhubarb. Beets. Canned vegetables. Rosita FirePickles. Olives. Meats and other protein foods Nuts. Nut butters. Large portions of meat, poultry, or fish. Salted or cured meats. Deli meats. Hot dogs. Sausages. Dairy Cheese. Beverages Regular soft drinks. Regular vegetable juice. Seasonings and other foods Seasoning blends with salt. Salad dressings. Canned soups. Soy sauce. Ketchup. Barbecue sauce. Canned pasta sauce. Casseroles. Pizza. Lasagna. Frozen meals. Potato chips. [place] fries. Summary  You can reduce your risk of kidney stones by making changes to your diet.  The most important thing you can do is drink enough fluid. You should drink enough fluid to keep your urine clear or pale yellow.  Ask your health care provider or dietitian how much protein from animal sources you should eat each day, and also how much salt and calcium you should have each day. This information is not intended to replace advice given to you by your health care provider. Make sure you discuss any questions you have with your health care provider. Document Revised: 01/15/2019 Document Reviewed: 09/05/2016 Elsevier Patient Education  2020 Elsevier Inc.   High Cholesterol  High cholesterol is a condition in  which the blood has high levels of a white, waxy, fat-like substance (cholesterol). The human body needs small amounts of cholesterol. The liver makes all the cholesterol that the body needs. Extra (excess) cholesterol comes from the food that we eat. Cholesterol is carried from the liver by the blood through the blood vessels. If you have high cholesterol, deposits (plaques) may build up on the walls of your blood vessels (arteries). Plaques make the arteries narrower and stiffer. Cholesterol plaques increase your risk for heart attack and stroke. Work with your health care provider to keep your cholesterol levels in a healthy range. What increases the risk? This condition is more likely to develop in people who:  Eat foods that are high in animal fat (saturated fat) or cholesterol.  Are overweight.  Are not getting enough exercise.  Have a family history of high cholesterol. What are the signs or symptoms? There are no symptoms of  this condition. How is this diagnosed? This condition may be diagnosed from the results of a blood test.  If you are older than age 64, your health care provider may check your cholesterol every 4-6 years.  You may be checked more often if you already have high cholesterol or other risk factors for heart disease. The blood test for cholesterol measures:  "Bad" cholesterol (LDL cholesterol). This is the main type of cholesterol that causes heart disease. The desired level for LDL is less than 100.  "Good" cholesterol (HDL cholesterol). This type helps to protect against heart disease by cleaning the arteries and carrying the LDL away. The desired level for HDL is 60 or higher.  Triglycerides. These are fats that the body can store or burn for energy. The desired number for triglycerides is lower than 150.  Total cholesterol. This is a measure of the total amount of cholesterol in your blood, including LDL cholesterol, HDL cholesterol, and triglycerides. A  healthy number is less than 200. How is this treated? This condition is treated with diet changes, lifestyle changes, and medicines. Diet changes  This may include eating more whole grains, fruits, vegetables, nuts, and fish.  This may also include cutting back on red meat and foods that have a lot of added sugar. Lifestyle changes  Changes may include getting at least 40 minutes of aerobic exercise 3 times a week. Aerobic exercises include walking, biking, and swimming. Aerobic exercise along with a healthy diet can help you maintain a healthy weight.  Changes may also include quitting smoking. Medicines  Medicines are usually given if diet and lifestyle changes have failed to reduce your cholesterol to healthy levels.  Your health care provider may prescribe a statin medicine. Statin medicines have been shown to reduce cholesterol, which can reduce the risk of heart disease. Follow these instructions at home: Eating and drinking If told by your health care provider:  Eat chicken (without skin), fish, veal, shellfish, ground [place] breast, and round or loin cuts of red meat.  Do not eat fried foods or fatty meats, such as hot dogs and salami.  Eat plenty of fruits, such as apples.  Eat plenty of vegetables, such as broccoli, potatoes, and carrots.  Eat beans, peas, and lentils.  Eat grains such as barley, rice, couscous, and bulgur wheat.  Eat pasta without cream sauces.  Use skim or nonfat milk, and eat low-fat or nonfat yogurt and cheeses.  Do not eat or drink whole milk, cream, ice cream, egg yolks, or hard cheeses.  Do not eat stick margarine or tub margarines that contain trans fats (also called partially hydrogenated oils).  Do not eat saturated tropical oils, such as coconut oil and palm oil.  Do not eat cakes, cookies, crackers, or other baked goods that contain trans fats.  General instructions  Exercise as directed by your health care provider. Increase your  activity level with activities such as gardening, walking, and taking the stairs.  Take over-the-counter and prescription medicines only as told by your health care provider.  Do not use any products that contain nicotine or tobacco, such as cigarettes and e-cigarettes. If you need help quitting, ask your health care provider.  Keep all follow-up visits as told by your health care provider. This is important. Contact a health care provider if:  You are struggling to maintain a healthy diet or weight.  You need help to start on an exercise program.  You need help to stop smoking. Get help  right away if:  You have chest pain.  You have trouble breathing. This information is not intended to replace advice given to you by your health care provider. Make sure you discuss any questions you have with your health care provider. Document Revised: 09/28/2017 Document Reviewed: 03/25/2016 Elsevier Patient Education  2020 [employer].  Cholesterol Content in Foods Cholesterol is a waxy, fat-like substance that helps to carry fat in the blood. The body needs cholesterol in small amounts, but too much cholesterol can cause damage to the arteries and heart. Most people should eat less than 200 milligrams (mg) of cholesterol a day. Foods with cholesterol  Cholesterol is found in animal-based foods, such as meat, seafood, and dairy. Generally, low-fat dairy and lean meats have less cholesterol than full-fat dairy and fatty meats. The milligrams of cholesterol per serving (mg per serving) of common cholesterol-containing foods are listed below. Meat and other proteins  Egg -- one large whole egg has 186 mg.  Veal shank -- 4 oz has 141 mg.  Lean ground [place] (93% lean) -- 4 oz has 118 mg.  Fat-trimmed lamb loin -- 4 oz has 106 mg.  Lean ground beef (90% lean) -- 4 oz has 100 mg.  Lobster -- 3.5 oz has 90 mg.  Pork loin chops -- 4 oz has 86 mg.  Canned salmon -- 3.5 oz has 83  mg.  Fat-trimmed beef top loin -- 4 oz has 78 mg.  Frankfurter -- 1 frank (3.5 oz) has 77 mg.  Crab -- 3.5 oz has 71 mg.  Roasted chicken without skin, white meat -- 4 oz has 66 mg.  Light bologna -- 2 oz has 45 mg.  Deli-cut [place] -- 2 oz has 31 mg.  Canned tuna -- 3.5 oz has 31 mg.  Tomasa Blase -- 1 oz has 29 mg.  Oysters and mussels (raw) -- 3.5 oz has 25 mg.  Mackerel -- 1 oz has 22 mg.  Trout -- 1 oz has 20 mg.  Pork sausage -- 1 link (1 oz) has 17 mg.  Salmon -- 1 oz has 16 mg.  Tilapia -- 1 oz has 14 mg. Dairy  Soft-serve ice cream --  cup (4 oz) has 103 mg.  Whole-milk yogurt -- 1 cup (8 oz) has 29 mg.  Cheddar cheese -- 1 oz has 28 mg.  American cheese -- 1 oz has 28 mg.  Whole milk -- 1 cup (8 oz) has 23 mg.  2% milk -- 1 cup (8 oz) has 18 mg.  Cream cheese -- 1 tablespoon (Tbsp) has 15 mg.  Cottage cheese --  cup (4 oz) has 14 mg.  Low-fat (1%) milk -- 1 cup (8 oz) has 10 mg.  Sour cream -- 1 Tbsp has 8.5 mg.  Low-fat yogurt -- 1 cup (8 oz) has 8 mg.  Nonfat Greek yogurt -- 1 cup (8 oz) has 7 mg.  Half-and-half cream -- 1 Tbsp has 5 mg. Fats and oils  Cod liver oil -- 1 tablespoon (Tbsp) has 82 mg.  Butter -- 1 Tbsp has 15 mg.  Lard -- 1 Tbsp has 14 mg.  Bacon grease -- 1 Tbsp has 14 mg.  Mayonnaise -- 1 Tbsp has 5-10 mg.  Margarine -- 1 Tbsp has 3-10 mg. Exact amounts of cholesterol in these foods may vary depending on specific ingredients and brands. Foods without cholesterol Most plant-based foods do not have cholesterol unless you combine them with a food that has cholesterol. Foods without cholesterol  include:  Grains and cereals.  Vegetables.  Fruits.  Vegetable oils, such as olive, canola, and sunflower oil.  Legumes, such as peas, beans, and lentils.  Nuts and seeds.  Egg whites. Summary  The body needs cholesterol in small amounts, but too much cholesterol can cause damage to the arteries and heart.  Most  people should eat less than 200 milligrams (mg) of cholesterol a day. This information is not intended to replace advice given to you by your health care provider. Make sure you discuss any questions you have with your health care provider. Document Revised: 09/07/2017 Document Reviewed: 05/22/2017 Elsevier Patient Education  2020 [employer].

## 2020-04-29 NOTE — Progress Notes (Signed)
Chief Complaint  Patient presents with  . Follow-up   F/u  1. Kidney stones right and left passed small one 04/06/20 had flomax, zofran and 14th hydrocodone with tylenol appt with urology upcoming and may need surg for stones on left wants to do in 05/2020. Reviewed CT chest ? Hydronephrosis rec [place] or CT and he states urology can do [place] in office no kidney stone pain at the moment  2. 4 mm RLL lung nodule still smoking 1 cig occasionally and thc daily rec cessation consider CT in 1 year and 2 years to f/u  3. MVP let me know when ready for echo  4. Vit d def rec D3 on 2400 IU daily    Review of Systems  Constitutional: Negative for weight loss.  HENT: Negative for hearing loss.   Eyes: Negative for blurred vision.  Respiratory: Negative for shortness of breath.   Cardiovascular: Negative for chest pain.  Gastrointestinal: Negative for abdominal pain.  Genitourinary: Negative for flank pain.  Musculoskeletal: Negative for falls.  Skin: Negative for rash.  Neurological: Negative for headaches.  Psychiatric/Behavioral: Negative for depression.   Past Medical History:  Diagnosis Date  . Anxiety   . Depression   . Drug abuse ([place])    h/o THC and ectasy use per pt clean since 2017 started drugs in HS  . Headache   . Insomnia   . 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 Kidney stones   . Kidney stones    Dr. Jacqlyn Larsen   . Kidney stones   . Strep throat   . Tick bite    Past Surgical History:  Procedure Laterality Date  . HAND SURGERY Right    2012 right hand has metal plate   . 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 RECONSTRUCTION OF NOSE     2006    Family History  Problem Relation Age of Onset  . Cancer Mother        GU cancer   . Depression Mother   . Hypertension Mother   . Depression Sister   . Kidney disease Sister   . Arthritis Maternal Grandmother   . Cancer Maternal Grandmother        GU cancer   . Alcohol abuse Maternal  Grandfather   . Cancer Other        GU cancer    Social History   Socioeconomic History  . Marital status: Married    Spouse name: Not on file  . Number of children: Not on file  . Years of education: Not on file  . Highest education level: Not on file  Occupational History  . Not on file  Tobacco Use  . Smoking status: Current Some Day Smoker    Packs/day: 0.50    Types: Cigarettes  . Smokeless tobacco: Never Used  Vaping Use  . Vaping Use: Never used  Substance and Sexual Activity  . Alcohol use: Yes    Comment: socially  . Drug use: No  . Sexual activity: Yes  Other Topics Concern  . 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 Difficulty of Paying Living Expenses:   Food Insecurity:   . Worried About Charity fundraiser in the Last Year:   . Arboriculturist in the Last Year:   Transportation Needs:   . Film/video editor (Medical):   Marland Kitchen Lack of Transportation (Non-Medical):   Physical Activity:   . Days of Exercise per Week:   . Minutes of Exercise per Session:   Stress:   . Feeling of Stress :   Social Connections:   . Frequency of Communication with Friends and Family:   . Frequency of Social Gatherings with Friends and Family:   . Attends Religious Services:   . Active Member of Clubs or Organizations:   . Attends Archivist Meetings:   Marland Kitchen Marital Status:   Intimate Partner Violence:   . Fear of Current or Ex-Partner:   . Emotionally Abused:   Marland Kitchen  Physically Abused:   . Sexually Abused:    No outpatient medications have been marked as taking for the 04/29/20 encounter (Office Visit) with McLean-Scocuzza, Nino Glow, MD.   Allergies  Allergen Reactions  . Lunesta [Eszopiclone]    Recent Results (from the past 2160 hour(s))  Iron, TIBC and Ferritin Panel     Status: None   Collection Time: 04/27/20  8:54 AM  Result Value Ref Range   Iron 122 50 - 195 mcg/dL   TIBC CANCELED     Comment:      Ferritin 82 38 - 380 ng/mL  TSH     Status: None   Collection Time: 04/27/20  8:54 AM  Result Value Ref Range   TSH 1.60 0.35 - 4.50 uIU/mL  CBC with Differential/Platelet     Status: Abnormal   Collection Time: 04/27/20  8:54 AM  Result Value Ref Range   WBC 6.0 4.0 - 10.5 K/uL   RBC 5.44 4.22 - 5.81 Mil/uL   Hemoglobin 17.4 (H) 13.0 - 17.0 g/dL   HCT 50.0 39 - 52 %   MCV 91.9 78.0 - 100.0 fl   MCHC 34.8 30.0 - 36.0 g/dL   RDW 13.0 11.5 - 15.5 %   Platelets 240.0 150 - 400 K/uL   Neutrophils Relative % 45.4 43 - 77 %   Lymphocytes Relative 39.0 12 - 46 %   Monocytes Relative 6.6 3 - 12 %   Eosinophils Relative 7.8 (H) 0 - 5 %   Basophils Relative 1.2 0 - 3 %   Neutro Abs 2.7 1.4 - 7.7 K/uL   Lymphs Abs 2.3 0.7 - 4.0 K/uL   Monocytes Absolute 0.4 0 - 1 K/uL   Eosinophils Absolute 0.5 0 - 0 K/uL   Basophils Absolute 0.1 0 - 0 K/uL  Lipid panel     Status: None   Collection Time: 04/27/20  8:54 AM  Result Value Ref Range   Cholesterol 157 0 - 200 mg/dL    Comment: ATP III Classification       Desirable:  < 200 mg/dL               Borderline High:  200 - 239 mg/dL          High:  > =  240 mg/dL   Triglycerides 84.0 0 - 149 mg/dL    Comment: Normal:  <150 mg/dLBorderline High:  150 - 199 mg/dL   HDL 49.00 >39.00 mg/dL   VLDL 16.8 0.0 - 40.0 mg/dL   LDL Cholesterol 91 0 - 99 mg/dL   Total CHOL/HDL Ratio 3     Comment:                Men          Women1/2 Average Risk     3.4          3.3Average Risk          5.0          4.42X  Average Risk          9.6          7.13X Average Risk          15.0          11.0                       NonHDL 107.67     Comment: NOTE:  Non-HDL goal should be 30 mg/dL higher than patient's LDL goal (i.e. LDL goal of < 70 mg/dL, would have non-HDL goal of < 100 mg/dL)  Comprehensive metabolic panel     Status: Abnormal   Collection Time: 04/27/20  8:54 AM  Result Value Ref Range   Sodium 140 135 - 145 mEq/L   Potassium 3.9 3.5 - 5.1 mEq/L   Chloride 101 96 - 112 mEq/L   CO2 29 19 - 32 mEq/L   Glucose, Bld 87 70 - 99 mg/dL   BUN 13 6 - 23 mg/dL   Creatinine, Ser 0.90 0.40 - 1.50 mg/dL   Total Bilirubin 1.3 (H) 0.2 - 1.2 mg/dL   Alkaline Phosphatase 61 39 - 117 U/L   AST 17 0 - 37 U/L   ALT 12 0 - 53 U/L   Total Protein 7.7 6.0 - 8.3 g/dL   Albumin 5.0 3.5 - 5.2 g/dL   GFR 99.62 >60.00 mL/min   Calcium 10.4 8.4 - 10.5 mg/dL  Urinalysis, Routine w reflex microscopic     Status: Abnormal   Collection Time: 04/27/20  8:55 AM  Result Value Ref Range   Color, Urine YELLOW YELLOW   APPearance CLEAR CLEAR   Specific Gravity, Urine 1.007 1.001 - 1.03   pH 8.0 5.0 - 8.0   Glucose, UA NEGATIVE NEGATIVE   Bilirubin Urine NEGATIVE NEGATIVE   Ketones, ur NEGATIVE NEGATIVE   Hgb urine dipstick NEGATIVE NEGATIVE   Protein, ur NEGATIVE NEGATIVE   Nitrite NEGATIVE NEGATIVE   Leukocytes,Ua TRACE (A) NEGATIVE   WBC, UA NONE SEEN 0 - 5 /HPF   RBC / HPF NONE SEEN 0 - 2 /HPF   Squamous Epithelial / LPF NONE SEEN < OR = 5 /HPF   Bacteria, UA NONE SEEN NONE SEEN /HPF   Hyaline Cast NONE SEEN NONE SEEN /LPF   Objective  Body mass index is 20.05 kg/m. Wt Readings from Last 3 Encounters:  04/29/20 135 lb 12.8 oz (61.6 kg)  01/31/20 138 lb (62.6 kg)  01/01/20 138 lb 3.2 oz (62.7 kg)   Temp Readings from Last 3 Encounters:  04/29/20 (!) 97.5 F (36.4 C) (Oral)  01/01/20 (!) 97 F (36.1 C)  06/12/19 97.9 F (36.6 C) (Oral)   BP Readings from Last 3 Encounters:  04/29/20 100/70  01/01/20  112/70  06/12/19 (!) 96/54   Pulse Readings from Last 3 Encounters:  04/29/20 73  01/01/20 73  06/12/19 (!) 56    Physical Exam Vitals and nursing note reviewed.  Constitutional:      Appearance: Normal appearance. He is well-developed.  HENT:     Head: Normocephalic and atraumatic.  Eyes:     Conjunctiva/sclera: Conjunctivae normal.     Pupils: Pupils are equal, round, and reactive to light.  Cardiovascular:     Rate and Rhythm: Normal rate and regular rhythm.     Heart sounds: Normal heart sounds. No murmur heard.   Pulmonary:     Effort: Pulmonary effort is normal.     Breath sounds: Normal breath sounds.  Abdominal:     Tenderness: There is no right CVA tenderness or left CVA tenderness.  Skin:    General: Skin is warm and dry.  Neurological:     General: No focal deficit present.     Mental Status: He is alert and oriented to person, place, and time. Mental status is at baseline.     Gait: Gait normal.  Psychiatric:        Attention and Perception: Attention and perception normal.        Mood and Affect: Mood and affect normal.        Speech: Speech normal.        Behavior: Behavior normal. Behavior is cooperative.        Thought Content: Thought content normal.        Cognition and Memory: Cognition and memory normal.        Judgment: Judgment normal.     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  rec smoking cessation  Pending epo,jak2   MH Declines flu  Tdap had 10/01/17  hep B immune MMR immune Declines STD testing  2/2 covid  vaccines  rec smoking cessationcongratulated on no cigsince 09/2018 but do rec stop THC use -6 months stopped smoking as of 04/29/20 smoking 1 cig socially   -needs repeat echo by 03/30/2020 had 03/31/15 mild valve regurgitation mild MVP repeat echo 3-5 years w/o sxs -as of 04/29/20 let me know when ready for echo  - CT chest ground glass noduleCT ab/pelvis 10/30/16 in right lower lobe 5 mmnoted on prior imaging -02/2020 CT chestreviewed 4 mm RLL   Provider: Dr. Olivia Mackie McLean-Scocuzza-Internal Medicine

## 2020-05-03 ENCOUNTER — Other Ambulatory Visit: Payer: BC Managed Care – PPO | Admitting: Orthotics

## 2020-05-03 ENCOUNTER — Telehealth: Payer: Self-pay | Admitting: Internal Medicine

## 2020-05-03 LAB — JAK2 EXONS 12-15

## 2020-05-03 LAB — JAK2  V617F QUAL. WITH REFLEX TO EXON 12

## 2020-05-03 NOTE — Telephone Encounter (Signed)
Where is vitamin D lab?   Wanted to add on from labs 04/29/20

## 2020-05-04 NOTE — Addendum Note (Signed)
Addended by: Warden Fillers on: 05/04/2020 10:44 AM   Modules accepted: Orders

## 2020-05-04 NOTE — Telephone Encounter (Signed)
1) This is the first request we received.   2) you ordered it on 7/22, he was drawn on 7/20 (and you ordered it twice)  3) We can fax over a add on sheet & see if they will add it. We have 7 days. So today is the final day to add on.

## 2020-05-04 NOTE — Telephone Encounter (Signed)
Did aleyah already do this if not please do thanks

## 2020-05-04 NOTE — Telephone Encounter (Signed)
Add on sheet faxed 

## 2020-05-04 NOTE — Addendum Note (Signed)
Addended by: Warden Fillers on: 05/04/2020 10:39 AM   Modules accepted: Orders

## 2020-05-06 ENCOUNTER — Other Ambulatory Visit: Payer: Self-pay

## 2020-05-06 ENCOUNTER — Ambulatory Visit: Payer: BC Managed Care – PPO | Admitting: Orthotics

## 2020-05-06 DIAGNOSIS — M7651 Patellar tendinitis, right knee: Secondary | ICD-10-CM

## 2020-05-06 DIAGNOSIS — G8929 Other chronic pain: Secondary | ICD-10-CM

## 2020-05-06 DIAGNOSIS — M2141 Flat foot [pes planus] (acquired), right foot: Secondary | ICD-10-CM

## 2020-05-06 DIAGNOSIS — M25572 Pain in left ankle and joints of left foot: Secondary | ICD-10-CM

## 2020-05-06 NOTE — Progress Notes (Signed)
Making heel wider and 1 full size larger.

## 2020-05-12 ENCOUNTER — Telehealth: Payer: Self-pay | Admitting: Internal Medicine

## 2020-05-12 NOTE — Telephone Encounter (Signed)
He will have to buy xyzal otc at [facility], [facility] may be cheaper

## 2020-05-12 NOTE — Telephone Encounter (Signed)
Prior authorization has been submitted for patient's Levocetirizine Dihydrochloride 5MG  tablets ON 05/04/20.  Awaiting approval or denial.

## 2020-05-12 NOTE — Telephone Encounter (Signed)
Prior authorization has been denied.   Please advise, the PA did not inform me why this was denied.

## 2020-05-12 NOTE — Telephone Encounter (Signed)
Left message for patient and informed that mychart will be sent as well.

## 2020-06-02 ENCOUNTER — Other Ambulatory Visit: Payer: BC Managed Care – PPO | Admitting: Orthotics

## 2021-03-09 ENCOUNTER — Telehealth: Payer: Self-pay | Admitting: Internal Medicine

## 2021-03-09 ENCOUNTER — Encounter: Payer: Self-pay | Admitting: Internal Medicine

## 2021-03-09 ENCOUNTER — Other Ambulatory Visit: Payer: Self-pay | Admitting: Internal Medicine

## 2021-03-09 DIAGNOSIS — T782XXA Anaphylactic shock, unspecified, initial encounter: Secondary | ICD-10-CM

## 2021-03-09 MED ORDER — EPINEPHRINE 0.3 MG/0.3ML IJ SOAJ: 0.3000 mg | INTRAMUSCULAR | 0 refills | Status: DC | PRN

## 2021-03-09 NOTE — Telephone Encounter (Signed)
Patient last seen 04/29/20, does he need an appointment?

## 2021-03-09 NOTE — Telephone Encounter (Signed)
PT called in to request for a prescription for a Epipen to be put in as [place]. He also states he has a history of kidney stone and was wanted to know if he can have some meds called in for that so while he is out of the [place] he is able to have meds ready.

## 2021-03-09 NOTE — Telephone Encounter (Signed)
See Patient message encounter from 03/09/21

## 2021-04-29 ENCOUNTER — Other Ambulatory Visit: Payer: BC Managed Care – PPO

## 2021-05-03 ENCOUNTER — Encounter: Payer: BC Managed Care – PPO | Admitting: Internal Medicine

## 2021-05-19 ENCOUNTER — Telehealth: Payer: Self-pay | Admitting: *Deleted

## 2021-05-19 DIAGNOSIS — E559 Vitamin D deficiency, unspecified: Secondary | ICD-10-CM

## 2021-05-19 DIAGNOSIS — Z13220 Encounter for screening for lipoid disorders: Secondary | ICD-10-CM

## 2021-05-19 DIAGNOSIS — Z1329 Encounter for screening for other suspected endocrine disorder: Secondary | ICD-10-CM

## 2021-05-19 DIAGNOSIS — D582 Other hemoglobinopathies: Secondary | ICD-10-CM

## 2021-05-19 NOTE — Telephone Encounter (Signed)
Please place future orders for lab appt.  

## 2021-05-21 NOTE — Telephone Encounter (Signed)
Yearly labs ordered

## 2021-07-08 ENCOUNTER — Telehealth (INDEPENDENT_AMBULATORY_CARE_PROVIDER_SITE_OTHER): Payer: Self-pay | Admitting: Internal Medicine

## 2021-07-08 ENCOUNTER — Encounter: Payer: Self-pay | Admitting: Internal Medicine

## 2021-07-08 DIAGNOSIS — F419 Anxiety disorder, unspecified: Secondary | ICD-10-CM

## 2021-07-08 NOTE — Progress Notes (Signed)
Virtual Visit via Caraegility Note  This visit type was conducted due to national recommendations for restrictions regarding the COVID-19 pandemic (e.g. social distancing).  This format is felt to be most appropriate for this patient at this time.  All issues noted in this document were discussed and addressed.  No physical exam was performed (except for noted visual exam findings with Video Visits).   I connected withNAME@ on 07/08/21 at  4:30 PM EDT by a video enabled telemedicine application and verified that I am speaking with the correct person using two identifiers. Location patient: home Location provider: work or home office Persons participating in the virtual visit: patient, provider  I discussed the limitations, risks, security and privacy concerns of performing an evaluation and management service by telephone and the availability of in person appointments. I also discussed with the patient that there may be a patient responsible charge related to this service. The patient expressed understanding and agreed to proceed.   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  He feels constantly pressed for time,  having frequent episodes of anxiety and insomnia, without panic attacks.  Has been exercising,  avoiding alcohol,  and finds that he benefits greatly from the companionship of his beloved dog,  a 115 lb  great Dane mix who accompanies him most places. He is requesting a letter to authorize his dog's status as an emotional support animal to protect his relationship from separation by apartment rules.    ROS: See pertinent positives and negatives per HPI.  Past Medical History:  Diagnosis Date   Anxiety    Depression    Drug abuse (HCC)    h/o THC and ectasy use per pt clean since 2017 started drugs in HS   Headache    Insomnia     Kidney papillary necrosis (HCC)    Kidney stones    Kidney stones    Dr. Achilles Dunk    Kidney stones    Strep throat    Tick bite     Past Surgical History:  Procedure Laterality Date   HAND SURGERY Right    2012 right hand has metal plate    KIDNEY SURGERY     at birth right [facility] obstructure pt was premature infant    RECONSTRUCTION OF NOSE     2006     Family History  Problem Relation Age of Onset   Cancer Mother        GU cancer    Depression Mother    Hypertension Mother    Depression Sister    Kidney disease Sister    Arthritis Maternal Grandmother    Cancer Maternal Grandmother        GU cancer    Alcohol abuse Maternal Grandfather    Cancer Other        GU cancer   SOCIAL HX: married  college Air cabin crew. Does not use alcohol or drugs    Current Outpatient Medications:    cetirizine (ZYRTEC) 10 MG tablet, Take 10 mg by mouth daily., Disp: , Rfl:    EPINEPHrine (EPIPEN 2-PAK) 0.3 mg/0.3 mL IJ SOAJ injection, Inject 0.3 mg into the muscle as needed for anaphylaxis., Disp: 2 each, Rfl: 0   fluticasone (FLONASE) 50 MCG/ACT nasal spray, Place into both nostrils daily., Disp: , Rfl:   EXAM:  VITALS  per patient if applicable:  GENERAL: alert, oriented, appears well and in no acute distress  HEENT: atraumatic, conjunttiva clear, no obvious abnormalities on inspection of external nose and ears  NECK: normal movements of the head and neck  LUNGS: on inspection no signs of respiratory distress, breathing rate appears normal, no obvious gross SOB, gasping or wheezing  CV: no obvious cyanosis  MS: moves all visible extremities without noticeable abnormality  PSYCH/NEURO: pleasant and cooperative, no obvious depression or anxiety, speech and thought processing grossly intact  ASSESSMENT AND PLAN:  Discussed the following assessment and plan:  Anxiety  Anxiety Aggravated by the pressures of academia.  He prefers to manage his symptoms without  pharmacotherapy and is using exercise and  the companionship of his dog, which he has found  to be calming and requests a letter authorizing the use of an emotional support animal.  Granted     I discussed the assessment and treatment plan with the patient. The patient was provided an opportunity to ask questions and all were answered. The patient agreed with the plan and demonstrated an understanding of the instructions.   The patient was advised to call back or seek an in-person evaluation if the symptoms worsen or if the condition fails to improve as anticipated.   I spent 30 minutes dedicated to the care of this patient on the date of this encounter to include pre-visit review of his medical history,  Face-to-face time with the patient , and post visit ordering of testing and therapeutics.    Sherlene Shams, MD

## 2021-07-08 NOTE — Assessment & Plan Note (Addendum)
Aggravated by the pressures of academia.  He prefers to manage his symptoms without pharmacotherapy and is using exercise and  the companionship of his dog, which he has found  to be calming and requests a letter authorizing the use of an emotional support animal.  Granted

## 2021-07-29 ENCOUNTER — Other Ambulatory Visit (INDEPENDENT_AMBULATORY_CARE_PROVIDER_SITE_OTHER): Payer: BC Managed Care – PPO

## 2021-07-29 ENCOUNTER — Other Ambulatory Visit: Payer: Self-pay

## 2021-07-29 DIAGNOSIS — Z13220 Encounter for screening for lipoid disorders: Secondary | ICD-10-CM

## 2021-07-29 DIAGNOSIS — Z1329 Encounter for screening for other suspected endocrine disorder: Secondary | ICD-10-CM | POA: Diagnosis not present

## 2021-07-29 DIAGNOSIS — D582 Other hemoglobinopathies: Secondary | ICD-10-CM | POA: Diagnosis not present

## 2021-07-29 DIAGNOSIS — E559 Vitamin D deficiency, unspecified: Secondary | ICD-10-CM

## 2021-07-29 NOTE — Addendum Note (Signed)
Addended by: Warden Fillers on: 07/29/2021 02:34 PM   Modules accepted: Orders

## 2021-07-30 LAB — CBC WITH DIFFERENTIAL/PLATELET
Absolute Monocytes: 718 cells/uL (ref 200–950)
Basophils Absolute: 78 cells/uL (ref 0–200)
Basophils Relative: 0.8 %
Eosinophils Absolute: 398 cells/uL (ref 15–500)
Eosinophils Relative: 4.1 %
HCT: 48.5 % (ref 38.5–50.0)
Hemoglobin: 16.9 g/dL (ref 13.2–17.1)
Lymphs Abs: 2134 cells/uL (ref 850–3900)
MCH: 31.5 pg (ref 27.0–33.0)
MCHC: 34.8 g/dL (ref 32.0–36.0)
MCV: 90.5 fL (ref 80.0–100.0)
MPV: 11.4 fL (ref 7.5–12.5)
Monocytes Relative: 7.4 %
Neutro Abs: 6373 cells/uL (ref 1500–7800)
Neutrophils Relative %: 65.7 %
Platelets: 254 10*3/uL (ref 140–400)
RBC: 5.36 10*6/uL (ref 4.20–5.80)
RDW: 12.3 % (ref 11.0–15.0)
Total Lymphocyte: 22 %
WBC: 9.7 10*3/uL (ref 3.8–10.8)

## 2021-07-30 LAB — VITAMIN D 25 HYDROXY (VIT D DEFICIENCY, FRACTURES): Vit D, 25-Hydroxy: 25 ng/mL — ABNORMAL LOW (ref 30–100)

## 2021-07-30 LAB — COMPREHENSIVE METABOLIC PANEL
AG Ratio: 1.7 (calc) (ref 1.0–2.5)
ALT: 9 U/L (ref 9–46)
AST: 18 U/L (ref 10–40)
Albumin: 4.8 g/dL (ref 3.6–5.1)
Alkaline phosphatase (APISO): 73 U/L (ref 36–130)
BUN: 9 mg/dL (ref 7–25)
CO2: 22 mmol/L (ref 20–32)
Calcium: 9.8 mg/dL (ref 8.6–10.3)
Chloride: 102 mmol/L (ref 98–110)
Creat: 0.92 mg/dL (ref 0.60–1.26)
Globulin: 2.9 g/dL (calc) (ref 1.9–3.7)
Glucose, Bld: 76 mg/dL (ref 65–99)
Potassium: 4 mmol/L (ref 3.5–5.3)
Sodium: 143 mmol/L (ref 135–146)
Total Bilirubin: 1.1 mg/dL (ref 0.2–1.2)
Total Protein: 7.7 g/dL (ref 6.1–8.1)

## 2021-07-30 LAB — TSH: TSH: 2.5 mIU/L (ref 0.40–4.50)

## 2021-07-30 LAB — LIPID PANEL
Cholesterol: 135 mg/dL (ref <200)
HDL: 44 mg/dL (ref >=40)
LDL Cholesterol (Calc): 72 mg/dL (calc)
Non-HDL Cholesterol (Calc): 91 mg/dL (calc) (ref <130)
Total CHOL/HDL Ratio: 3.1 (calc) (ref <5.0)
Triglycerides: 105 mg/dL (ref <150)

## 2021-08-04 NOTE — Progress Notes (Signed)
Left message for patient to return call back for lab results.

## 2021-08-05 ENCOUNTER — Other Ambulatory Visit: Payer: Self-pay

## 2021-08-05 ENCOUNTER — Ambulatory Visit (INDEPENDENT_AMBULATORY_CARE_PROVIDER_SITE_OTHER): Payer: BC Managed Care – PPO | Admitting: Internal Medicine

## 2021-08-05 ENCOUNTER — Encounter: Payer: Self-pay | Admitting: Internal Medicine

## 2021-08-05 VITALS — BP 108/74 | HR 80 | Ht 69.65 in | Wt 140.2 lb

## 2021-08-05 DIAGNOSIS — F419 Anxiety disorder, unspecified: Secondary | ICD-10-CM | POA: Diagnosis not present

## 2021-08-05 DIAGNOSIS — T782XXA Anaphylactic shock, unspecified, initial encounter: Secondary | ICD-10-CM

## 2021-08-05 DIAGNOSIS — Z0000 Encounter for general adult medical examination without abnormal findings: Secondary | ICD-10-CM | POA: Diagnosis not present

## 2021-08-05 DIAGNOSIS — Z13220 Encounter for screening for lipoid disorders: Secondary | ICD-10-CM | POA: Diagnosis not present

## 2021-08-05 DIAGNOSIS — Z1329 Encounter for screening for other suspected endocrine disorder: Secondary | ICD-10-CM

## 2021-08-05 DIAGNOSIS — Z1389 Encounter for screening for other disorder: Secondary | ICD-10-CM

## 2021-08-05 DIAGNOSIS — E559 Vitamin D deficiency, unspecified: Secondary | ICD-10-CM

## 2021-08-05 MED ORDER — HYDROXYZINE PAMOATE 25 MG PO CAPS: 25.0000 mg | ORAL_CAPSULE | Freq: Every evening | ORAL | 3 refills | Status: DC | PRN

## 2021-08-05 MED ORDER — EPINEPHRINE 0.3 MG/0.3ML IJ SOAJ: 0.3000 mg | INTRAMUSCULAR | 2 refills | Status: DC | PRN

## 2021-08-05 NOTE — Progress Notes (Signed)
Chief Complaint  Patient presents with   Annual Exam   Annual  1. Anxiety requesting non addictive med hydroxyzine due to multiple tasks in graduate school he gets overwhelmed at times given lists of therapists/psychiatrist with [facility] [place] locations 2. Reviewed labs  3. Requesting refill epi pen left his in the hot car other meds are otc zyrtec and flonase 4. Right knee pain chronic intermittent noted since 2015 and had xray then disc emerge ortho referral in desired in future or sports medicine   Review of Systems  Constitutional:  Negative for weight loss.  HENT:  Negative for hearing loss.   Eyes:  Negative for blurred vision.  Respiratory:  Negative for shortness of breath.   Cardiovascular:  Negative for chest pain.  Gastrointestinal:  Negative for abdominal pain.  Musculoskeletal:  Negative for falls and joint pain.  Skin:  Negative for rash.  Neurological:  Negative for headaches.  Psychiatric/Behavioral:  The patient is nervous/anxious.   Past Medical History:  Diagnosis Date   Anxiety    Depression    Drug abuse ([place])    h/o THC and ectasy use per pt clean since 2017 started drugs in HS   Headache    Insomnia    Kidney papillary necrosis (HCC)    Kidney stones    Kidney stones    Dr. Jacqlyn Larsen    Kidney stones    Strep throat    Tick bite    Past Surgical History:  Procedure Laterality Date   HAND SURGERY Right    2012 right hand has metal plate    KIDNEY SURGERY     at birth right [facility] obstructure pt was premature infant    RECONSTRUCTION OF NOSE     2006    Family History  Problem Relation Age of Onset   Cancer Mother        GU cancer    Depression Mother    Hypertension Mother    Depression Sister    Kidney disease Sister    Arthritis Maternal Grandmother    Cancer Maternal Grandmother        GU cancer    Alcohol abuse Maternal Grandfather    Cancer Other        GU cancer    Social History   Socioeconomic History   Marital status: Married     Spouse name: Not on file   Number of children: Not on file   Years of education: Not on file   Highest education level: Not on file  Occupational History   Not on file  Tobacco Use   Smoking status: Former    Packs/day: 0.50    Types: Cigarettes    Quit date: 03/23/2021    Years since quitting: 0.3   Smokeless tobacco: Never  Vaping Use   Vaping Use: Never used  Substance and Sexual Activity   Alcohol use: Yes    Comment: socially   Drug use: No   Sexual activity: Yes  Other Topics Concern   Not on file  Social History Narrative   No guns    Wears seat belt    Married Jinny Blossom is wife she is PA   Interested in Environmental consultant [place] history/culture and Bronze age/ancient languages   worked as Development worker, 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 focusing on school    Some college courses completed [employer] now at [employer] will graduate 02/2020 and wants to do grad school in [place] or [place] which will take 5 years he  wants to be professor>>07/2021 student [employer] will be defending thesis    3 dogs    As of 12/2019 got into [place] [employer] grad school    Social Determinants of Health   Financial Resource Strain: Not on file  Food Insecurity: Not on file  Transportation Needs: Not on file  Physical Activity: Not on file  Stress: Not on file  Social Connections: Not on file  Intimate Partner Violence: Not on file   Current Meds  Medication Sig   cetirizine (ZYRTEC) 10 MG tablet Take 10 mg by mouth daily.   fluticasone (FLONASE) 50 MCG/ACT nasal spray Place into both nostrils daily.   hydrOXYzine (VISTARIL) 25 MG capsule Take 1-2 capsules (25-50 mg total) by mouth at bedtime as needed.   [DISCONTINUED] EPINEPHrine (EPIPEN 2-PAK) 0.3 mg/0.3 mL IJ SOAJ injection Inject 0.3 mg into the muscle as needed for anaphylaxis.   Allergies  Allergen Reactions   Lunesta [Eszopiclone]    Recent Results (from the past 2160 hour(s))  Comp Met (CMET)     Status: None   Collection Time: 07/29/21  2:48 PM  Result Value  Ref Range   Glucose, Bld 76 65 - 99 mg/dL    Comment: .            Fasting reference interval .    BUN 9 7 - 25 mg/dL   Creat 0.92 0.60 - 1.26 mg/dL   BUN/Creatinine Ratio NOT APPLICABLE 6 - 22 (calc)   Sodium 143 135 - 146 mmol/L   Potassium 4.0 3.5 - 5.3 mmol/L   Chloride 102 98 - 110 mmol/L   CO2 22 20 - 32 mmol/L   Calcium 9.8 8.6 - 10.3 mg/dL   Total Protein 7.7 6.1 - 8.1 g/dL   Albumin 4.8 3.6 - 5.1 g/dL   Globulin 2.9 1.9 - 3.7 g/dL (calc)   AG Ratio 1.7 1.0 - 2.5 (calc)   Total Bilirubin 1.1 0.2 - 1.2 mg/dL   Alkaline phosphatase (APISO) 73 36 - 130 U/L   AST 18 10 - 40 U/L   ALT 9 9 - 46 U/L  Lipid panel     Status: None   Collection Time: 07/29/21  2:48 PM  Result Value Ref Range   Cholesterol 135 <200 mg/dL   HDL 44 > OR = 40 mg/dL   Triglycerides 105 <150 mg/dL   LDL Cholesterol (Calc) 72 mg/dL (calc)    Comment: Reference range: <100 . Desirable range <100 mg/dL for primary prevention;   <70 mg/dL for patients with CHD or diabetic patients  with > or = 2 CHD risk factors. Marland Kitchen LDL-C is now calculated using the Martin-Hopkins  calculation, which is a validated novel method providing  better accuracy than the Friedewald equation in the  estimation of LDL-C.  Cresenciano Genre et al. Annamaria Helling. 8416;606(30): 2061-2068  (http://education.[facility].com/faq/FAQ164)    Total CHOL/HDL Ratio 3.1 <5.0 (calc)   Non-HDL Cholesterol (Calc) 91 <130 mg/dL (calc)    Comment: For patients with diabetes plus 1 major ASCVD risk  factor, treating to a non-HDL-C goal of <100 mg/dL  (LDL-C of <70 mg/dL) is considered a therapeutic  option.   Vitamin D (25 hydroxy)     Status: Abnormal   Collection Time: 07/29/21  2:48 PM  Result Value Ref Range   Vit D, 25-Hydroxy 25 (L) 30 - 100 ng/mL    Comment: Vitamin D Status         25-OH Vitamin D: . Deficiency:                    <  20 ng/mL Insufficiency:             20 - 29 ng/mL Optimal:                 > or = 30 ng/mL . For 25-OH  Vitamin D testing on patients on  D2-supplementation and patients for whom quantitation  of D2 and D3 fractions is required, the Quest[facility]ureD(TM) 25-OH VIT D, (D2,D3), LC/MS/MS is recommended: order  code [id number] (patients >105yr). See Note 1 . Note 1 . For additional information, please refer to  http://education.[facility].com/faq/FAQ199  (This link is being provided for informational/ educational purposes only.)   CBC w/Diff     Status: None   Collection Time: 07/29/21  2:48 PM  Result Value Ref Range   WBC 9.7 3.8 - 10.8 Thousand/uL   RBC 5.36 4.20 - 5.80 Million/uL   Hemoglobin 16.9 13.2 - 17.1 g/dL   HCT 48.5 38.5 - 50.0 %   MCV 90.5 80.0 - 100.0 fL   MCH 31.5 27.0 - 33.0 pg   MCHC 34.8 32.0 - 36.0 g/dL   RDW 12.3 11.0 - 15.0 %   Platelets 254 140 - 400 Thousand/uL   MPV 11.4 7.5 - 12.5 fL   Neutro Abs 6,373 1,500 - 7,800 cells/uL   Lymphs Abs 2,134 850 - 3,900 cells/uL   Absolute Monocytes 718 200 - 950 cells/uL   Eosinophils Absolute 398 15 - 500 cells/uL   Basophils Absolute 78 0 - 200 cells/uL   Neutrophils Relative % 65.7 %   Total Lymphocyte 22.0 %   Monocytes Relative 7.4 %   Eosinophils Relative 4.1 %   Basophils Relative 0.8 %  TSH     Status: None   Collection Time: 07/29/21  2:48 PM  Result Value Ref Range   TSH 2.50 0.40 - 4.50 mIU/L   Objective  Body mass index is 20.32 kg/m. Wt Readings from Last 3 Encounters:  08/05/21 140 lb 3.2 oz (63.6 kg)  07/08/21 135 lb (61.2 kg)  04/29/20 135 lb 12.8 oz (61.6 kg)   Temp Readings from Last 3 Encounters:  04/29/20 (!) 97.5 F (36.4 C) (Oral)  01/01/20 (!) 97 F (36.1 C)  06/12/19 97.9 F (36.6 C) (Oral)   BP Readings from Last 3 Encounters:  08/05/21 108/74  04/29/20 100/70  01/01/20 112/70   Pulse Readings from Last 3 Encounters:  08/05/21 80  04/29/20 73  01/01/20 73    Physical Exam Vitals and nursing note reviewed.  Constitutional:      Appearance: Normal appearance. He is  well-developed and well-groomed.  HENT:     Head: Normocephalic and atraumatic.  Eyes:     Conjunctiva/sclera: Conjunctivae normal.     Pupils: Pupils are equal, round, and reactive to light.  Cardiovascular:     Rate and Rhythm: Normal rate and regular rhythm.     Heart sounds: Normal heart sounds. No murmur heard. Pulmonary:     Effort: Pulmonary effort is normal.     Breath sounds: Normal breath sounds.  Abdominal:     General: Abdomen is flat. Bowel sounds are normal.  Skin:    General: Skin is warm and dry.  Neurological:     General: No focal deficit present.     Mental Status: He is alert and oriented to person, place, and time. Mental status is at baseline.     Gait: Gait normal.  Psychiatric:        Attention and Perception: Attention and perception normal.  Mood and Affect: Mood and affect normal.        Speech: Speech normal.        Behavior: Behavior normal. Behavior is cooperative.        Thought Content: Thought content normal.        Cognition and Memory: Cognition and memory normal.        Judgment: Judgment normal.    Assessment  Plan  Annual physical exam Declines flu  Tdap had 10/01/17  hep B immune MMR immune Declines STD testing  3/3 covid vaccines Consider prevnar vaccine with smoking history   rec smoking cessation    -needs repeat echo by 03/30/2020 had 03/31/15 mild valve regurgitation mild MVP repeat echo 3-5 years w/o sxs -as of 04/29/20 let me know when ready for echo, same response 08/05/21    - CT chest ground glass nodule CT ab/pelvis 10/30/16 in right lower lobe 5 mm  noted on prior imaging  -02/2020 CT chest reviewed 4 mm RLL    Let me know if ortho/sm referral desired for chronic right knee pain on and off since 2015 as of 08/05/21   Anxiety - Plan: hydrOXYzine (VISTARIL) 25-50  MG capsule qhs prn  Call [facility] for psych and thearpy  Anaphylaxis, initial encounter - Plan: EPINEPHrine (EPIPEN 2-PAK) 0.3 mg/0.3 mL IJ SOAJ  injection    Provider: Dr. Olivia Mackie McLean-Scocuzza-Internal Medicine

## 2021-08-05 NOTE — Patient Instructions (Addendum)
D3 4000 IU daily total   [facility] [place]    Call for appt they have therapy and psychiatry  [facility] [address]  [address]  [place] [address] [phone]    [facility]  [address]  [place] [address]  [phone]   [facility] or [facility] for your knees if you want referral let know or go to [facility] Dr. Charlann Boxer [facility] in [place]  Let me know when ready for echo of your heart  Hydroxyzine Capsules or Tablets What is this medication? HYDROXYZINE (hye DROX i zeen) treats the symptoms of allergies and allergic reactions. It may also be used to treat anxiety or cause drowsiness before a procedure. It works by blocking histamine, a substance released by the body during an allergic reaction. It belongs to a group of medications called antihistamines. This medicine may be used for other purposes; ask your health care provider or pharmacist if you have questions. COMMON BRAND NAME(S): ANX, Atarax, Rezine, Vistaril What should I tell my care team before I take this medication? They need to know if you have any of these conditions: Glaucoma Heart disease History of irregular heartbeat Kidney disease Liver disease Lung or breathing disease, like asthma Stomach or intestine problems Thyroid disease Trouble passing urine An unusual or allergic reaction to hydroxyzine, cetirizine, other medications, foods, dyes or preservatives Pregnant or trying to get pregnant Breast-feeding How should I use this medication? Take this medication by mouth with a full glass of water. Follow the directions on the prescription label. You may take this medication with food or on an empty stomach. Take your medication at regular intervals. Do not take your medication more often than directed. Talk to your care team regarding the use of this medication in children. Special care may be needed. While this  medication may be prescribed for children as young as 51 years of age for selected conditions, precautions do apply. Patients over 9 years old may have a stronger reaction and need a smaller dose. Overdosage: If you think you have taken too much of this medicine contact a poison control center or emergency room at once. NOTE: This medicine is only for you. Do not share this medicine with others. What if I miss a dose? If you miss a dose, take it as soon as you can. If it is almost time for your next dose, take only that dose. Do not take double or extra doses. What may interact with this medication? Do not take this medication with any of the following: Cisapride Dronedarone Pimozide Thioridazine This medication may also interact with the following: Alcohol Antihistamines for allergy, cough, and cold Atropine Barbiturate medications for sleep or seizures, like phenobarbital Certain antibiotics like erythromycin or clarithromycin Certain medications for anxiety or sleep Certain medications for bladder problems like oxybutynin, tolterodine Certain medications for depression or psychotic disturbances Certain medications for irregular heart beat Certain medications for Parkinson's disease like benztropine, trihexyphenidyl Certain medications for seizures like phenobarbital, primidone Certain medications for stomach problems like dicyclomine, hyoscyamine Certain medications for travel sickness like scopolamine Ipratropium Narcotic medications for pain Other medications that prolong the QT interval (which can cause an abnormal heart rhythm) like dofetilide This list may not describe all possible interactions. Give your health care provider a list of all the medicines, herbs, non-prescription drugs, or dietary supplements you use. Also tell them if you smoke, drink alcohol, or use illegal drugs. Some  items may interact with your medicine. What should I watch for while using this  medication? Tell your care team if your symptoms do not improve. You may get drowsy or dizzy. Do not drive, use machinery, or do anything that needs mental alertness until you know how this medication affects you. Do not stand or sit up quickly, especially if you are an older patient. This reduces the risk of dizzy or fainting spells. Alcohol may interfere with the effect of this medication. Avoid alcoholic drinks. Your mouth may get dry. Chewing sugarless gum or sucking hard candy, and drinking plenty of water may help. Contact your care team if the problem does not go away or is severe. This medication may cause dry eyes and blurred vision. If you wear contact lenses you may feel some discomfort. Lubricating drops may help. See your eye care specialist if the problem does not go away or is severe. If you are receiving skin tests for allergies, tell your care team you are using this medication. What side effects may I notice from receiving this medication? Side effects that you should report to your care team as soon as possible: Allergic reactions-skin rash, itching, hives, swelling of the face, lips, tongue, or throat Heart rhythm changes-fast or irregular heartbeat, dizziness, feeling faint or lightheaded, chest pain, trouble breathing Side effects that usually do not require medical attention (report to your care team if they continue or are bothersome): Confusion Drowsiness Dry mouth Hallucinations Headache This list may not describe all possible side effects. Call your doctor for medical advice about side effects. You may report side effects to FDA at 1-800-FDA-1088. Where should I keep my medication? Keep out of the reach of children and pets. Store at room temperature between 15 and 30 degrees C (59 and 86 degrees F). Keep container tightly closed. Throw away any unused medication after the expiration date. NOTE: This sheet is a summary. It may not cover all possible information. If you  have questions about this medicine, talk to your doctor, pharmacist, or health care provider.  2022 Elsevier/Gold Standard (2020-12-07 15:19:25)    Vitamin D Deficiency Vitamin D deficiency is when your body does not have enough vitamin D. Vitamin D is important to your body for many reasons: It helps the body absorb two important minerals--calcium and phosphorus. It plays a role in bone health. It may help to prevent some diseases, such as diabetes and multiple sclerosis. It plays a role in muscle function, including heart function. If vitamin D deficiency is severe, it can cause a condition in which your bones become soft. In adults, this condition is called osteomalacia. In children, this condition is called rickets. What are the causes? This condition may be caused by: Not eating enough foods that contain vitamin D. Not getting enough natural sun exposure. Having certain digestive system diseases that make it difficult for your body to absorb vitamin D. These diseases include Crohn's disease, chronic pancreatitis, and cystic fibrosis. Having a surgery in which a part of the stomach or a part of the small intestine is removed. Having chronic kidney disease or liver disease. What increases the risk? You are more likely to develop this condition if you: Are older. Do not spend much time outdoors. Live in a long-term care facility. Have had broken bones. Have weak or thin bones (osteoporosis). Have a disease or condition that changes how the body absorbs vitamin D. Have dark skin. Take certain medicines, such as steroid medicines or certain seizure medicines.  Are overweight or obese. What are the signs or symptoms? In mild cases of vitamin D deficiency, there may not be any symptoms. If the condition is severe, symptoms may include: Bone pain. Muscle pain. Falling often. Broken bones caused by a minor injury. How is this diagnosed? This condition may be diagnosed with blood  tests. Imaging tests such as X-rays may also be done to look for changes in the bone. How is this treated? Treatment for this condition may depend on what caused the condition. Treatment options include: Taking vitamin D supplements. Your health care provider will suggest what dose is best for you. Taking a calcium supplement. Your health care provider will suggest what dose is best for you. Follow these instructions at home: Eating and drinking  Eat foods that contain vitamin D. Choices include: Fortified dairy products, cereals, or juices. Fortified means that vitamin D has been added to the food. Check the label on the package to see if the food is fortified. Fatty fish, such as salmon or trout. Eggs. Oysters. Mushrooms. The items listed above may not be a complete list of recommended foods and beverages. Contact a dietitian for more information. General instructions Take medicines and supplements only as told by your health care provider. Get regular, safe exposure to natural sunlight. Do not use a tanning bed. Maintain a healthy weight. Lose weight if needed. Keep all follow-up visits as told by your health care provider. This is important. How is this prevented? You can get vitamin D by: Eating foods that naturally contain vitamin D. Eating or drinking products that have been fortified with vitamin D, such as cereals, juices, and dairy products (including milk). Taking a vitamin D supplement or a multivitamin supplement that contains vitamin D. Being in the sun. Your body naturally makes vitamin D when your skin is exposed to sunlight. Your body changes the sunlight into a form of the vitamin that it can use. Contact a health care provider if: Your symptoms do not go away. You feel nauseous or you vomit. You have fewer bowel movements than usual or are constipated. Summary Vitamin D deficiency is when your body does not have enough vitamin D. Vitamin D is important to your body  for good bone health and muscle function, and it may help prevent some diseases. Vitamin D deficiency is primarily treated through supplementation. Your health care provider will suggest what dose is best for you. You can get vitamin D by eating foods that contain vitamin D, by being in the sun, and by taking a vitamin D supplement or a multivitamin supplement that contains vitamin D. This information is not intended to replace advice given to you by your health care provider. Make sure you discuss any questions you have with your health care provider. Document Revised: 06/03/2018 Document Reviewed: 06/03/2018 Elsevier Patient Education  2022 [employer].

## 2022-01-31 ENCOUNTER — Encounter: Payer: Self-pay | Admitting: Internal Medicine

## 2022-01-31 ENCOUNTER — Ambulatory Visit (INDEPENDENT_AMBULATORY_CARE_PROVIDER_SITE_OTHER): Payer: BC Managed Care – PPO | Admitting: Internal Medicine

## 2022-01-31 VITALS — BP 120/86 | HR 65 | Temp 98.1°F | Resp 14 | Ht 69.65 in | Wt 142.0 lb

## 2022-01-31 DIAGNOSIS — W57XXXA Bitten or stung by nonvenomous insect and other nonvenomous arthropods, initial encounter: Secondary | ICD-10-CM

## 2022-01-31 DIAGNOSIS — L03313 Cellulitis of chest wall: Secondary | ICD-10-CM

## 2022-01-31 DIAGNOSIS — S20361A Insect bite (nonvenomous) of right front wall of thorax, initial encounter: Secondary | ICD-10-CM | POA: Diagnosis not present

## 2022-01-31 DIAGNOSIS — R110 Nausea: Secondary | ICD-10-CM

## 2022-01-31 MED ORDER — MUPIROCIN 2 % EX OINT: 1.0000 "application " | TOPICAL_OINTMENT | Freq: Two times a day (BID) | CUTANEOUS | 0 refills | Status: DC

## 2022-01-31 MED ORDER — ONDANSETRON 4 MG PO TBDP: 4.0000 mg | ORAL_TABLET | Freq: Three times a day (TID) | ORAL | 0 refills | Status: DC | PRN

## 2022-01-31 MED ORDER — DOXYCYCLINE HYCLATE 100 MG PO TABS: 100.0000 mg | ORAL_TABLET | Freq: Two times a day (BID) | ORAL | 0 refills | Status: DC

## 2022-01-31 NOTE — Progress Notes (Signed)
Chief Complaint  ?Patient presents with  ? Acute Visit  ?  Spider bite near collarbone. Spot is red,itchy, and tender. Occurred yesterday around lunchtime. Pt starting to have chills,fatigue, and muscle aches. Unknown how it happened wife noticed bite.   ? ?Acute  ?Spider bite vs other bug bite to right mid chest redness is spreading and puncture wound inner chest red itchy to touch getting chills muscle aches bite had yesterday around lunch walking dogs  ? ?Review of Systems  ?Constitutional:  Negative for weight loss.  ?HENT:  Negative for hearing loss.   ?Eyes:  Negative for blurred vision.  ?Respiratory:  Negative for shortness of breath.   ?Cardiovascular:  Negative for chest pain.  ?Gastrointestinal:  Negative for abdominal pain and blood in stool.  ?Musculoskeletal:  Negative for back pain.  ?Skin:  Negative for rash.  ?Neurological:  Negative for headaches.  ?Psychiatric/Behavioral:  Negative for depression.   ?Past Medical History:  ?Diagnosis Date  ? Anxiety   ? Depression   ? Drug abuse (HCC)   ? h/o THC and ectasy use per pt clean since 2017 started drugs in HS  ? Headache   ? Insomnia   ? Kidney papillary necrosis (HCC)   ? Kidney stones   ? Kidney stones   ? Dr. Achilles Dunkope   ? Kidney stones   ? Strep throat   ? Tick bite   ? ?Past Surgical History:  ?Procedure Laterality Date  ? HAND SURGERY Right   ? 2012 right hand has metal plate   ? KIDNEY SURGERY    ? at birth right [facility] pt was premature infant   ? RECONSTRUCTION OF NOSE    ? 2006   ? ?Family History  ?Problem Relation Age of Onset  ? Cancer Mother   ?     GU cancer   ? Depression Mother   ? Hypertension Mother   ? Depression Sister   ? Kidney disease Sister   ? Arthritis Maternal Grandmother   ? Cancer Maternal Grandmother   ?     GU cancer   ? Alcohol abuse Maternal Grandfather   ? Cancer Other   ?     GU cancer   ? ?Social History  ? ?Socioeconomic History  ? Marital status: Married  ?  Spouse name: Not on file  ? Number of children: Not  on file  ? Years of education: Not on file  ? Highest education level: Not on file  ?Occupational History  ? Not on file  ?Tobacco Use  ? Smoking status: Former  ?  Packs/day: 0.50  ?  Types: Cigarettes  ?  Quit date: 03/23/2021  ?  Years since quitting: 0.8  ? Smokeless tobacco: Never  ?Vaping Use  ? Vaping Use: Never used  ?Substance and Sexual Activity  ? Alcohol use: Yes  ?  Comment: socially  ? Drug use: No  ? Sexual activity: Yes  ?Other Topics Concern  ? Not on file  ?Social History Narrative  ? No guns   ? Wears seat belt   ? Married Aundra MilletMegan is wife she is PA  ? Interested in classics/archeology I.e [place] history/culture and Bronze age/ancient languages  ? 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 Some college courses completed [employer] now at [employer] will graduate 02/2020 and wants to do grad school in [place] or [place] which will take 5 years he wants to be professor>>07/2021 student [employer] will  be defending thesis   ? 3 dogs   ? As of 12/2019 got into [employer] grad school   ? ?Social Determinants of Health  ? ?Financial Resource Strain: Not on file  ?Food Insecurity: Not on file  ?Transportation Needs: Not on file  ?Physical Activity: Not on file  ?Stress: Not on file  ?Social Connections: Not on file  ?Intimate Partner Violence: Not on file  ? ?Current Meds  ?Medication Sig  ? cetirizine (ZYRTEC) 10 MG tablet Take 10 mg by mouth daily.  ? doxycycline (VIBRA-TABS) 100 MG tablet Take 1 tablet (100 mg total) by mouth 2 (two) times daily. With food  ? EPINEPHrine (EPIPEN 2-PAK) 0.3 mg/0.3 mL IJ SOAJ injection Inject 0.3 mg into the muscle as needed for anaphylaxis.  ? fluticasone (FLONASE) 50 MCG/ACT nasal spray Place into both nostrils daily.  ? hydrOXYzine (VISTARIL) 25 MG capsule Take 1-2 capsules (25-50 mg total) by mouth at bedtime as needed.  ? mupirocin ointment (BACTROBAN) 2 % Apply 1 application. topically 2 (two) times daily. Prn open wounds  ? ondansetron (ZOFRAN-ODT) 4 MG disintegrating  tablet Take 1 tablet (4 mg total) by mouth every 8 (eight) hours as needed for nausea or vomiting.  ? ?Allergies  ?Allergen Reactions  ? Lunesta [Eszopiclone]   ? ?No results found for this or any previous visit (from the past 2160 hour(s)). ?Objective  ?Body mass index is 20.58 kg/m?. ?Wt Readings from Last 3 Encounters:  ?01/31/22 142 lb (64.4 kg)  ?08/05/21 140 lb 3.2 oz (63.6 kg)  ?07/08/21 135 lb (61.2 kg)  ? ?Temp Readings from Last 3 Encounters:  ?01/31/22 98.1 ?F (36.7 ?C) (Oral)  ?04/29/20 (!) 97.5 ?F (36.4 ?C) (Oral)  ?01/01/20 (!) 97 ?F (36.1 ?C)  ? ?BP Readings from Last 3 Encounters:  ?01/31/22 120/86  ?08/05/21 108/74  ?04/29/20 100/70  ? ?Pulse Readings from Last 3 Encounters:  ?01/31/22 65  ?08/05/21 80  ?04/29/20 73  ? ? ?Physical Exam ?Vitals and nursing note reviewed.  ?Constitutional:   ?   Appearance: Normal appearance. He is well-developed and well-groomed.  ?HENT:  ?   Head: Normocephalic and atraumatic.  ?Eyes:  ?   Conjunctiva/sclera: Conjunctivae normal.  ?   Pupils: Pupils are equal, round, and reactive to light.  ?Cardiovascular:  ?   Rate and Rhythm: Normal rate and regular rhythm.  ?   Heart sounds: Normal heart sounds.  ?Pulmonary:  ?   Effort: Pulmonary effort is normal. No respiratory distress.  ?   Breath sounds: Normal breath sounds.  ?Chest:  ? ? ?Abdominal:  ?   Tenderness: There is no abdominal tenderness.  ?Skin: ?   General: Skin is warm and moist.  ? ?    ?Neurological:  ?   General: No focal deficit present.  ?   Mental Status: He is alert and oriented to person, place, and time. Mental status is at baseline.  ?   Sensory: Sensation is intact.  ?   Motor: Motor function is intact.  ?   Coordination: Coordination is intact.  ?   Gait: Gait is intact. Gait normal.  ?Psychiatric:     ?   Attention and Perception: Attention and perception normal.     ?   Mood and Affect: Mood and affect normal.     ?   Speech: Speech normal.     ?   Behavior: Behavior normal. Behavior is  cooperative.     ?   Thought Content:  Thought content normal.     ?   Cognition and Memory: Cognition and memory normal.     ?   Judgment: Judgment normal.  ? ? ?Assessment  ?Plan  ?Cellulitis of chest wall ? ?Insect bite of right front wall of thorax, initial encounter - Plan: mupirocin ointment (BACTROBAN) 2 %, doxycycline (VIBRA-TABS) 100 MG tablet bid x 7-10 days with food avoid sun ?Wash in antibacterial soap ? ?Nausea - Plan: ondansetron (ZOFRAN-ODT) 4 MG disintegrating tablet  ?Provider: Dr. French Ana McLean-Scoc[facility]  ?

## 2022-01-31 NOTE — Patient Instructions (Addendum)
Dial/dove antibacterial body wash/hibiclens, or chlorhexidine  ?If not better by Thursday let me know  ? ?Insect Bite, Adult ?An insect bite can make your skin red, itchy, and swollen. An insect bite is different from an insect sting, which happens when an insect injects poison (venom) into the skin. ?Some insects can spread disease to people through a bite. However, most insect bites do not lead to disease and are not serious. ?What are the causes? ?Insects may bite for a variety of reasons, including: ?Hunger. ?To defend themselves. ?Insects that bite include: ?Spiders. ?Mosquitoes. ?Ticks. ?Fleas. ?Ants. ?Flies. ?Kissing bugs. ?Chiggers. ?What are the signs or symptoms? ?Symptoms of this condition include: ?Itching or pain in the bite area. ?Redness and swelling in the bite area. ?An open wound (skin ulcer). ?In many cases, symptoms last for 2-4 days. ?In rare cases, a person may have a severe allergic reaction (anaphylactic reaction) to a bite. Symptoms of an anaphylactic reaction may include: ?Feeling warm in the face (flushed). This may include redness. ?Itchy, red, swollen areas of skin (hives). ?Swelling of the eyes, lips, face, mouth, tongue, or throat. ?Difficulty breathing, speaking, or swallowing. ?Noisy breathing (wheezing). ?Dizziness or light-headedness. ?Fainting. ?Pain or cramping in the abdomen. ?Vomiting. ?Diarrhea. ?How is this diagnosed? ?This condition is usually diagnosed based on symptoms and a physical exam. ?How is this treated? ?Treatment is usually not needed. Symptoms often go away on their own. When treatment is recommended, it may involve: ?Applying a cream or lotion to the bite area. This treatment helps with itching. ?Taking an antibiotic medicine. This treatment is needed if the bite area gets infected. ?Getting a tetanus shot, if you are not up to date on this vaccine. ?Applying ice to the affected area. ?Allergy medicines called antihistamines. This treatment may be needed if you  develop itching or an allergic reaction to the insect bite. ?Giving yourself an epinephrine injection if you have an anaphylactic reaction to a bite. To give the injection, you will use what is commonly called an auto-injector "pen" (pre-filled automatic epinephrine injection device). Your health care provider will teach you how to use an auto-injector pen. ?Follow these instructions at home: ?Bite area care ? ?Do not scratch the bite area. ?Keep the bite area clean and dry. Wash it every day with soap and water as told by your health care provider. ?Check the bite area every day for signs of infection. Check for: ?Redness, swelling, or pain. ?Fluid or blood. ?Warmth. ?Pus or a bad smell. ?Managing pain, itching, and swelling ? ?You may apply cortisone cream, calamine lotion, or a paste made of baking soda and water to the bite area as told by your health care provider. ?If directed, put ice on the bite area. ?Put ice in a plastic bag. ?Place a towel between your skin and the bag. ?Leave the ice on for 20 minutes, 2-3 times a day. ?General instructions ?Apply or take over-the-counter and prescription medicines only as told by your health care provider. ?If you were prescribed an antibiotic medicine, take or apply it as told by your health care provider. Do not stop using the antibiotic even if your condition improves. ?Keep all follow-up visits as told by your health care provider. This is important. ?How is this prevented? ?To help reduce your risk of insect bites: ?When you are outdoors, wear clothing that covers your arms and legs. This is especially important in the early morning and evening. ?Use insect repellent. The best insect repellents contain DEET, picaridin,  oil of lemon eucalyptus (OLE), or IR3535. ?Consider spraying your clothing with a pesticide called permethrin. Permethrin helps prevent insect bites. It works for several weeks and for up to 5-6 clothing washes. Do not apply permethrin directly to  the skin. ?If your home windows do not have screens, consider installing them. ?If you will be sleeping in an area where there are mosquitoes, consider covering your sleeping area with a mosquito net. ?Contact a health care provider if: ?You have redness, swelling, or pain in the bite area. ?You have fluid or blood coming from the bite area. ?The bite area feels warm to the touch. ?You have pus or a bad smell coming from the bite area. ?You have a fever. ?Get help right away if: ?You have joint pain. ?You have a rash. ?You feel unusually tired or sleepy. ?You have neck pain. ?You have a headache. ?You have unusual weakness. ?You develop symptoms of an anaphylactic reaction. These may include: ?Flushed skin. ?Hives. ?Swelling of the eyes, lips, face, mouth, tongue, or throat. ?Difficulty breathing, speaking, or swallowing. ?Wheezing. ?Dizziness or light-headedness. ?Fainting. ?Pain or cramping in the abdomen. ?Vomiting. ?Diarrhea. ?These symptoms may represent a serious problem that is an emergency. Do not wait to see if the symptoms will go away. Do the following right away: ?Use the auto-injector pen as you have been instructed. ?Get medical help. Call your local emergency services (911 in the [place] Do not drive yourself to the hospital. ?Summary ?An insect bite can make your skin red, itchy, and swollen. ?Treatment is usually not needed. Symptoms often go away on their own. When treatment is recommended, it may involve taking medicine, applying medicine to the area, or applying ice. ?Apply or take over-the-counter and prescription medicines only as told by your health care provider. ?Use insect repellent to help prevent insect bites. ?Contact a health care provider if you have any signs of infection in the bite area. ?This information is not intended to replace advice given to you by your health care provider. Make sure you discuss any questions you have with your health care provider. ?Document Revised:  06/27/2021 Document Reviewed: 06/27/2021 ?Elsevier Patient Education ? 2023 Elsevier Inc. ? ? ? ?

## 2022-02-14 ENCOUNTER — Encounter: Payer: Self-pay | Admitting: Internal Medicine

## 2022-02-24 ENCOUNTER — Encounter: Payer: Self-pay | Admitting: Internal Medicine

## 2022-02-24 DIAGNOSIS — J329 Chronic sinusitis, unspecified: Secondary | ICD-10-CM | POA: Diagnosis not present

## 2022-02-24 DIAGNOSIS — J310 Chronic rhinitis: Secondary | ICD-10-CM | POA: Diagnosis not present

## 2022-03-01 IMAGING — CT CT CHEST W/O CM
2 of 4 series · 15 of 36 positions shown, 18 images · non-contrast
Comparison: None.

CLINICAL DATA: Follow-up lung nodule

EXAM:
CT CHEST WITHOUT CONTRAST
TECHNIQUE: Multidetector CT imaging of the chest was performed following the
standard protocol without IV contrast.

[Series 2: chest 2.00 · axial · 0.66mm/px · z∈[-1325,-1005]mm · 12 of 190 slices shown, 15 images]
[im 15/190  mediastinal]
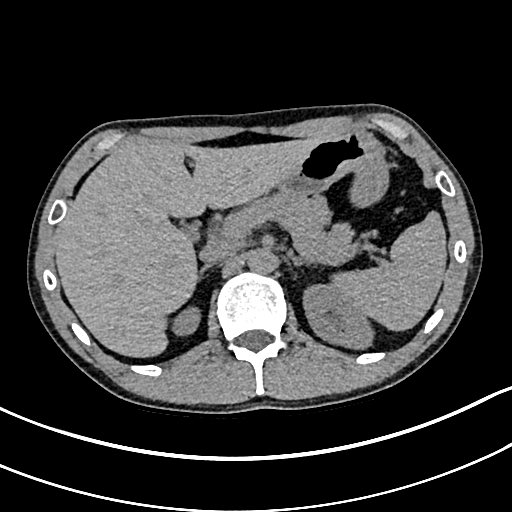
[im 15/190  lung]
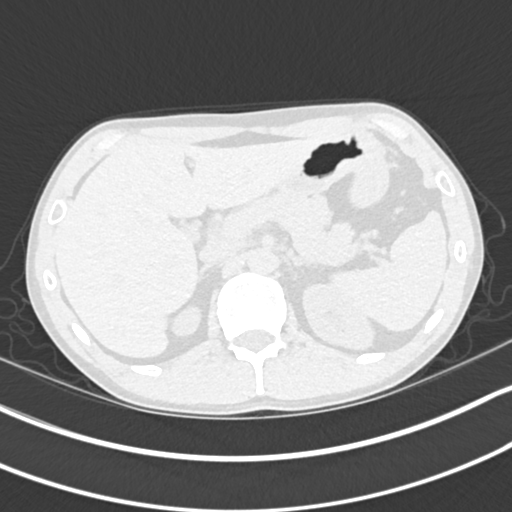
[im 30/190  lung]
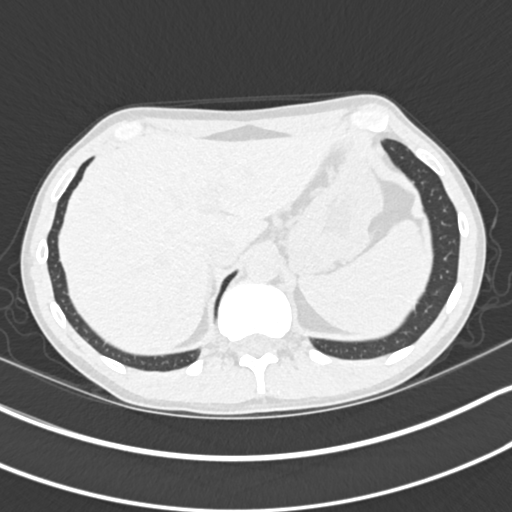
[im 44/190  lung]
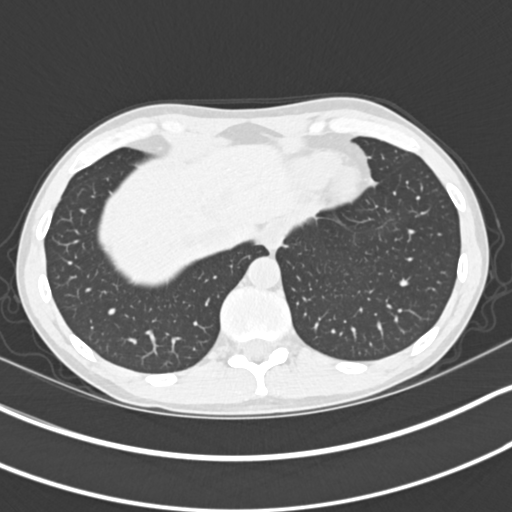
[im 59/190  lung]
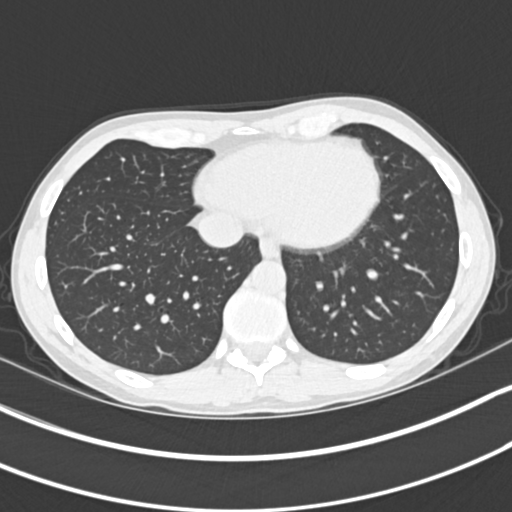
[im 73/190  mediastinal]
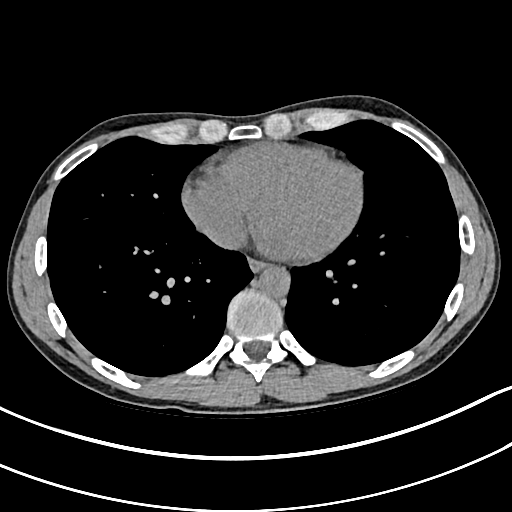
[im 73/190  lung]
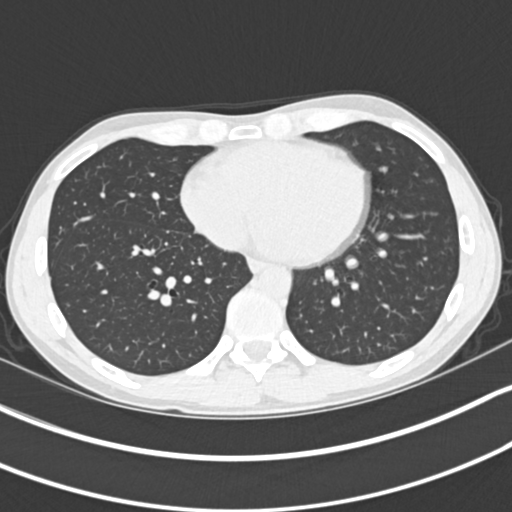
[im 88/190  lung]
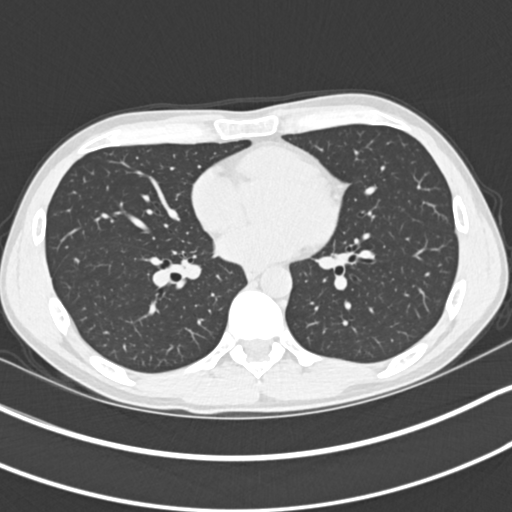
[im 102/190  lung]
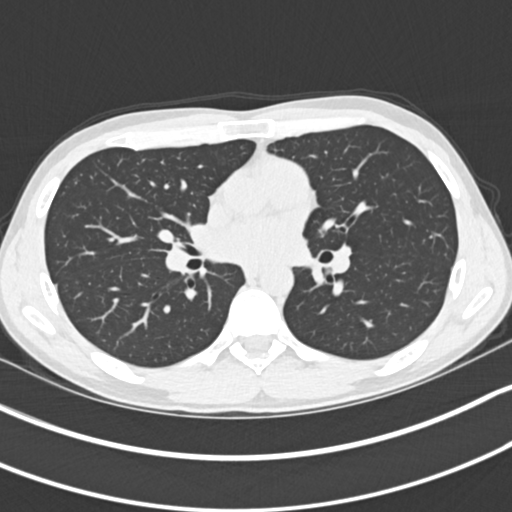
[im 117/190  lung]
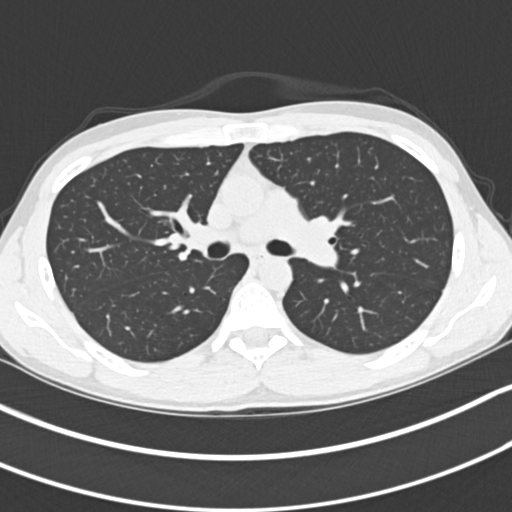
[im 131/190  mediastinal]
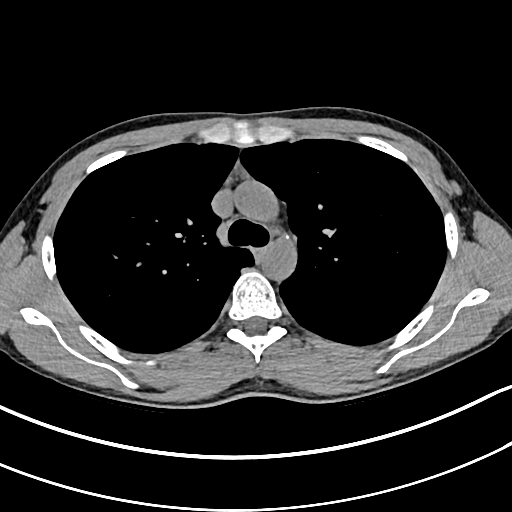
[im 131/190  lung]
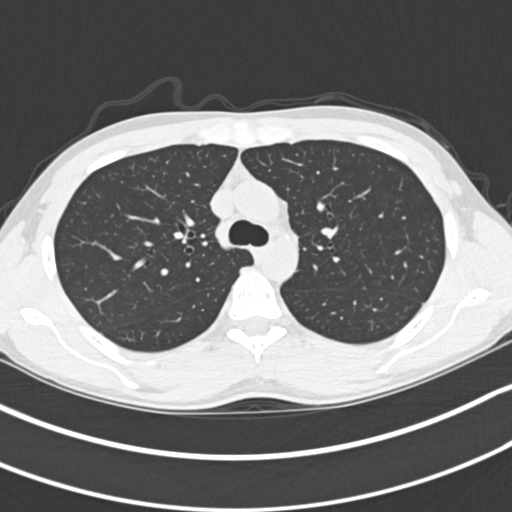
[im 146/190  lung]
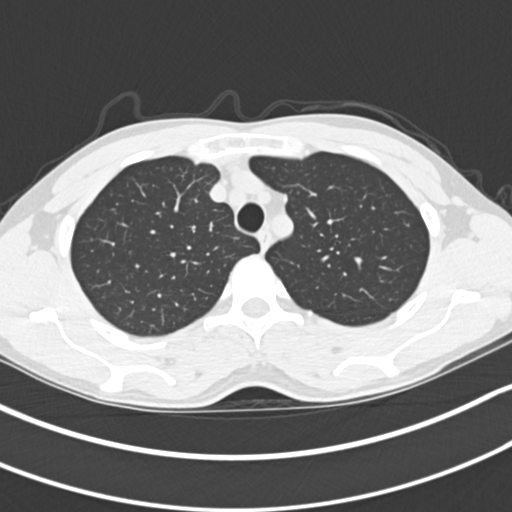
[im 160/190  lung]
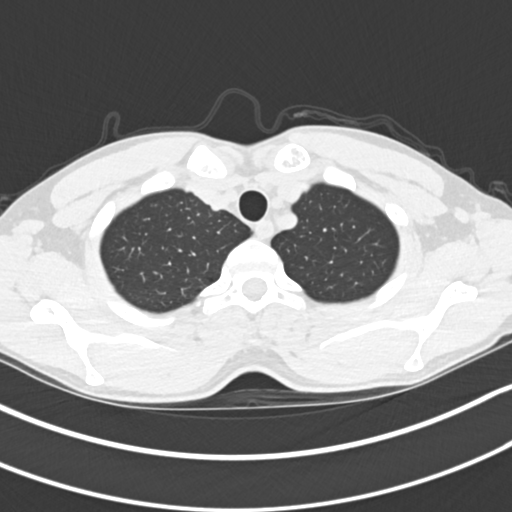
[im 175/190  lung]
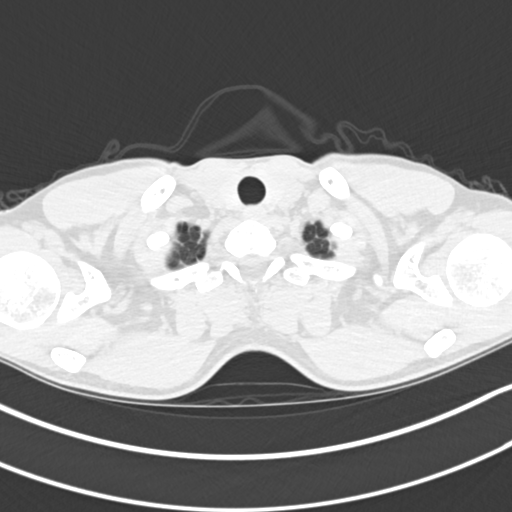

[Series 5: coronals chest 2.00 cor · coronal · 0.66mm/px · 3 of 104 slices shown]
[im 21/104  lung]
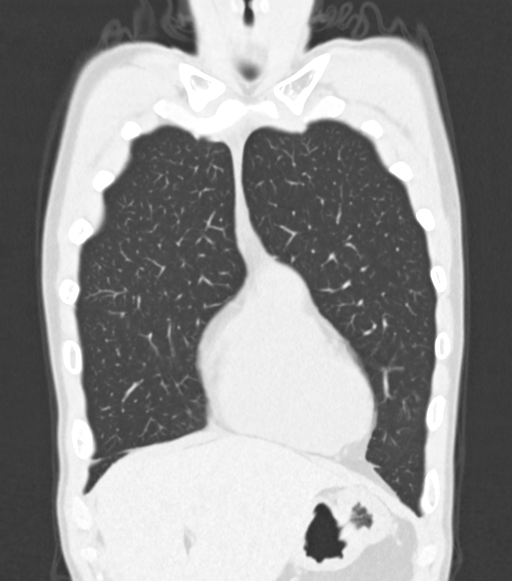
[im 42/104  lung]
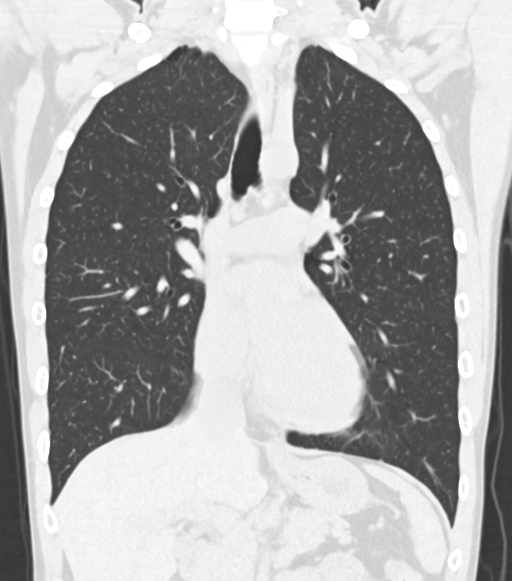
[im 62/104  lung]
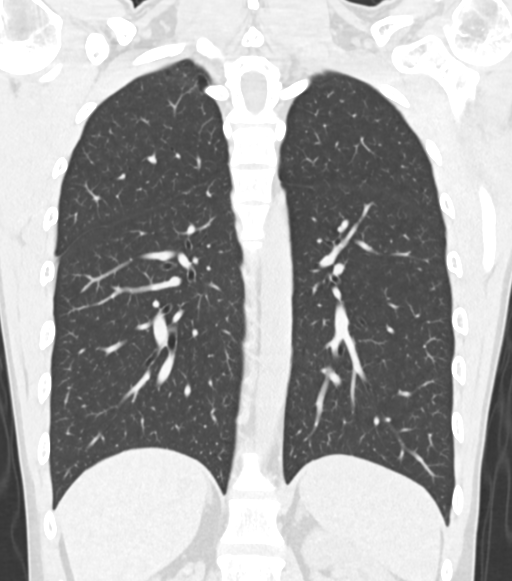

[15 of 36 positions shown; findings below may reference images not displayed]

FINDINGS: Cardiovascular: Heart is normal size. Aorta is normal caliber.

Mediastinum/Nodes: No mediastinal, hilar, or axillary adenopathy.
Trachea and esophagus are unremarkable. Thyroid unremarkable.

Lungs/Pleura: 4 mm nodule in the posterior right lower lobe on image
145 of series 3. No additional pulmonary nodules. No confluent
opacities or effusions.

Upper Abdomen: There appears to be fullness of the collecting
systems bilaterally in the visualized upper kidneys suggesting
hydronephrosis, but not adequately visualized to confirm or
determine cause. Small stones seen in the upper pole of the left
kidney.

Musculoskeletal: Chest wall soft tissues are unremarkable. No acute
bony abnormality.
IMPRESSION: 4 mm posterior right lower lobe pulmonary nodule. No follow-up
needed if patient is low-risk. Non-contrast chest CT can be
considered in 12 months if patient is high-risk. This recommendation
follows the consensus statement: Guidelines for Management of
Incidental Pulmonary Nodules Detected on CT Images: From the

Apparent hydronephrosis seen in the upper poles of the kidneys
bilaterally, but difficult to confirm or determine cause on this
chest CT. Consider renal ultrasound or abdominal CT if felt
clinically indicated.

Left upper pole nephrolithiasis.

## 2022-04-26 DIAGNOSIS — J310 Chronic rhinitis: Secondary | ICD-10-CM | POA: Diagnosis not present

## 2022-04-26 DIAGNOSIS — J329 Chronic sinusitis, unspecified: Secondary | ICD-10-CM | POA: Diagnosis not present

## 2022-08-01 ENCOUNTER — Other Ambulatory Visit: Payer: BC Managed Care – PPO

## 2022-08-03 ENCOUNTER — Encounter: Payer: BC Managed Care – PPO | Admitting: Internal Medicine

## 2023-11-26 ENCOUNTER — Ambulatory Visit: Payer: Managed Care, Other (non HMO) | Admitting: Internal Medicine

## 2023-11-26 ENCOUNTER — Encounter: Payer: Self-pay | Admitting: Internal Medicine

## 2023-11-26 VITALS — BP 128/62 | HR 68 | Ht 69.5 in | Wt 156.4 lb

## 2023-11-26 DIAGNOSIS — R110 Nausea: Secondary | ICD-10-CM

## 2023-11-26 DIAGNOSIS — Z0000 Encounter for general adult medical examination without abnormal findings: Secondary | ICD-10-CM

## 2023-11-26 DIAGNOSIS — R5383 Other fatigue: Secondary | ICD-10-CM | POA: Diagnosis not present

## 2023-11-26 DIAGNOSIS — E785 Hyperlipidemia, unspecified: Secondary | ICD-10-CM

## 2023-11-26 DIAGNOSIS — R197 Diarrhea, unspecified: Secondary | ICD-10-CM

## 2023-11-26 DIAGNOSIS — F1911 Other psychoactive substance abuse, in remission: Secondary | ICD-10-CM

## 2023-11-26 DIAGNOSIS — F4321 Adjustment disorder with depressed mood: Secondary | ICD-10-CM

## 2023-11-26 LAB — COMPREHENSIVE METABOLIC PANEL
ALT: 21 U/L (ref 0–53)
AST: 21 U/L (ref 0–37)
Albumin: 4.6 g/dL (ref 3.5–5.2)
Alkaline Phosphatase: 78 U/L (ref 39–117)
BUN: 7 mg/dL (ref 6–23)
CO2: 30 meq/L (ref 19–32)
Calcium: 9.7 mg/dL (ref 8.4–10.5)
Chloride: 101 meq/L (ref 96–112)
Creatinine, Ser: 0.87 mg/dL (ref 0.40–1.50)
GFR: 113.94 mL/min (ref >60.00)
Glucose, Bld: 86 mg/dL (ref 70–99)
Potassium: 4.7 meq/L (ref 3.5–5.1)
Sodium: 140 meq/L (ref 135–145)
Total Bilirubin: 0.5 mg/dL (ref 0.2–1.2)
Total Protein: 7.4 g/dL (ref 6.0–8.3)

## 2023-11-26 LAB — CBC WITH DIFFERENTIAL/PLATELET
Basophils Absolute: 0.1 10*3/uL (ref 0.0–0.1)
Basophils Relative: 1.1 % (ref 0.0–3.0)
Eosinophils Absolute: 0.4 10*3/uL (ref 0.0–0.7)
Eosinophils Relative: 5.5 % — ABNORMAL HIGH (ref 0.0–5.0)
HCT: 47 % (ref 39.0–52.0)
Hemoglobin: 16.1 g/dL (ref 13.0–17.0)
Lymphocytes Relative: 35.3 % (ref 12.0–46.0)
Lymphs Abs: 2.3 10*3/uL (ref 0.7–4.0)
MCHC: 34.4 g/dL (ref 30.0–36.0)
MCV: 91.6 fL (ref 78.0–100.0)
Monocytes Absolute: 0.6 10*3/uL (ref 0.1–1.0)
Monocytes Relative: 9.9 % (ref 3.0–12.0)
Neutro Abs: 3.1 10*3/uL (ref 1.4–7.7)
Neutrophils Relative %: 48.2 % (ref 43.0–77.0)
Platelets: 338 10*3/uL (ref 150.0–400.0)
RBC: 5.13 Mil/uL (ref 4.22–5.81)
RDW: 12.9 % (ref 11.5–15.5)
WBC: 6.5 10*3/uL (ref 4.0–10.5)

## 2023-11-26 LAB — LIPID PANEL
Cholesterol: 120 mg/dL (ref 0–200)
HDL: 30.5 mg/dL — ABNORMAL LOW (ref >39.00)
LDL Cholesterol: 66 mg/dL (ref 0–99)
NonHDL: 89.12
Total CHOL/HDL Ratio: 4
Triglycerides: 118 mg/dL (ref 0.0–149.0)
VLDL: 23.6 mg/dL (ref 0.0–40.0)

## 2023-11-26 LAB — MAGNESIUM: Magnesium: 2 mg/dL (ref 1.5–2.5)

## 2023-11-26 LAB — TSH: TSH: 1.15 u[IU]/mL (ref 0.35–5.50)

## 2023-11-26 MED ORDER — ONDANSETRON 4 MG PO TBDP: 4.0000 mg | ORAL_TABLET | Freq: Three times a day (TID) | ORAL | 0 refills | Status: AC | PRN

## 2023-11-26 NOTE — Assessment & Plan Note (Signed)
He declines pharmacotherapy at  this time, preferring the use of THC

## 2023-11-26 NOTE — Progress Notes (Signed)
Patient ID: David [name], male    DOB: [birth year]  Age: 33 y.o. MRN: [id number]  The patient is here for annual preventive examination and management of other chronic and acute problems.   The risk factors are reflected in the social history.   The roster of all physicians providing medical care to patient - is listed in the Snapshot section of the chart.   Activities of daily living:  The patient is 100% independent in all ADLs: dressing, toileting, feeding as well as independent mobility   Home safety : The patient has smoke detectors in the home. They wear seatbelts.  There are no unsecured firearms at home. There is no violence in the home.    There is no risks for hepatitis, STDs or HIV. There is no   history of blood transfusion. They have no travel history to infectious disease endemic areas of the world.   The patient has seen their dentist in the last six month. They have seen their eye doctor in the last year. The patinet  denies slight hearing difficulty with regard to whispered voices and some television programs.  They have deferred audiologic testing in the last year.  They do not  have excessive sun exposure. Discussed the need for sun protection: hats, long sleeves and use of sunscreen if there is significant sun exposure.    Diet: the importance of a healthy diet is discussed. They do have a healthy diet.   The benefits of regular aerobic exercise were discussed. The patient  exercises  7 days per week  for  60 minutes.    Depression screen: there are no signs or vegative symptoms of depression- irritability, change in appetite, anhedonia, sadness/tearfullness.   The following portions of the patient's history were reviewed and updated as appropriate: allergies, current medications, past family history, past medical history,  past surgical history, past social history  and problem list.   Visual acuity was not assessed per patient preference since the patient has regular  follow up with an  ophthalmologist. Hearing and body mass index were assessed and reviewed.    During the course of the visit the patient was educated and counseled about appropriate screening and preventive services including : fall prevention , diabetes screening, nutrition counseling, colorectal cancer screening, and recommended immunizations.    Chief Complaint:    Grief;  Father died of stage 4 lung CA  last year while he was sick with COVID .  ENDORSES IRRITABILITY  SHORT FUSE . NOT WITH WIFE AND 1 YR OLD  SON, FINN. Marland Kitchen  Works at [employer]. Has a master's degree in archaeology,  gave up his dream to be a present father.  Averages 3.5 miles of walking daily with his 2 dogs   Recovering from norovirus 2 weeks ago  ; had 8 hours of vomiting,  dry heaving  was exhausted for days,     had sinus congestion for several     Review of Symptoms  Patient denies headache, fevers, malaise, unintentional weight loss, skin rash, eye pain, sinus congestion and sinus pain, sore throat, dysphagia,  hemoptysis , cough, dyspnea, wheezing, chest pain, palpitations, orthopnea, edema, abdominal pain, nausea, melena, diarrhea, constipation, flank pain, dysuria, hematuria, urinary  Frequency, nocturia, numbness, tingling, seizures,  Focal weakness, Loss of consciousness,  Tremor, insomnia, depression, anxiety, and suicidal ideation.    Physical Exam:  BP 128/62   Pulse 68   Ht 5' 9.5" (1.765 m)   Wt 156 lb 6.4  oz (70.9 kg)   SpO2 99%   BMI 22.77 kg/m    Physical Exam Vitals reviewed.  Constitutional:      General: He is not in acute distress.    Appearance: Normal appearance. He is normal weight. He is not ill-appearing, toxic-appearing or diaphoretic.  HENT:     Head: Normocephalic and atraumatic.     Right Ear: Tympanic membrane, ear canal and external ear normal. There is no impacted cerumen.     Left Ear: Tympanic membrane, ear canal and external ear normal. There is no impacted cerumen.      Nose: Nose normal.     Mouth/Throat:     Mouth: Mucous membranes are moist.     Pharynx: Oropharynx is clear.  Eyes:     General: No scleral icterus.       Right eye: No discharge.        Left eye: No discharge.     Conjunctiva/sclera: Conjunctivae normal.  Neck:     Thyroid: No thyromegaly.     Vascular: No carotid bruit or JVD.  Cardiovascular:     Rate and Rhythm: Normal rate and regular rhythm.     Heart sounds: Normal heart sounds.  Pulmonary:     Effort: Pulmonary effort is normal. No respiratory distress.     Breath sounds: Normal breath sounds.  Abdominal:     General: Bowel sounds are normal.     Palpations: Abdomen is soft. There is no mass.     Tenderness: There is no abdominal tenderness. There is no guarding or rebound.  Musculoskeletal:        General: Normal range of motion.     Cervical back: Normal range of motion and neck supple.  Lymphadenopathy:     Cervical: No cervical adenopathy.  Skin:    General: Skin is warm and dry.  Neurological:     General: No focal deficit present.     Mental Status: He is alert and oriented to person, place, and time. Mental status is at baseline.  Psychiatric:        Mood and Affect: Mood normal.        Behavior: Behavior normal.        Thought Content: Thought content normal.        Judgment: Judgment normal.    Assessment and Plan: Dyslipidemia -     Lipid panel -     Comprehensive metabolic panel  Other fatigue -     CBC with Differential/Platelet -     TSH  Nausea -     Ondansetron; Take 1 tablet (4 mg total) by mouth every 8 (eight) hours as needed for nausea or vomiting.  Dispense: 30 tablet; Refill: 0  Diarrhea, unspecified type -     Magnesium  History of substance abuse [facility])  Unresolved grief Assessment & Plan: He declines pharmacotherapy at  this time, preferring the use of [facility]    Annual physical exam Assessment & Plan: age appropriate education and counseling updated, referrals for  preventative services and immunizations addressed, dietary and smoking counseling addressed, most recent labs reviewed.  I have personally reviewed and have noted:   1) the patient's medical and social history 2) The pt's use of alcohol, tobacco, and illicit drugs 3) The patient's current medications and supplements 4) Functional ability including ADL's, fall risk, home safety risk, hearing and visual impairment 5) Diet and physical activities 6) Evidence for depression or mood disorder 7) The patient's height, weight, and BMI  have been recorded in the chart\rhythm.     I have made referrals, and provided counseling and education based on review of the above      Return in about 1 year (around 11/25/2024) for physical.  Sherlene Shams, MD

## 2023-11-26 NOTE — Patient Instructions (Signed)
Nice to meet you, David [name]  Let me know if you change your mind about meds

## 2023-11-26 NOTE — Assessment & Plan Note (Signed)

## 2023-11-26 NOTE — Assessment & Plan Note (Signed)
4 mm posterior right lower lobe pulmonary nodule noted in 2021 on chest CT. He is considered low risk.

## 2023-11-26 NOTE — Progress Notes (Deleted)
Subjective:  Patient ID: David [name], male    DOB: 1990/12/06  Age: 33 y.o. MRN: [id number]  CC: The primary encounter diagnosis was Dyslipidemia. Diagnoses of Other fatigue and Nausea were also pertinent to this visit.   HPI [name] presents for establishment of care/CPE  Chief Complaint  Patient presents with   Establish Care   Grief;  Father died of stage 4 lung CA  last year while he was sick with COVID .  ENDORSES IRRITABILITY  SHORT FUSE . NOT WITH WIFE AND 1 YR OLD  SON, FINN. 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  Averages 3.5 miles of walking daily with his 2 dogs   Recovering from norovirus 2 weeks ago  ; had 8 hours of vomiting,  dry heaving  was exhausted for days,     had sinus congestion for several      Outpatient Medications Prior to Visit  Medication Sig Dispense Refill   cetirizine (ZYRTEC) 10 MG tablet Take 10 mg by mouth daily.     EPINEPHrine (EPIPEN 2-PAK) 0.3 mg/0.3 mL IJ SOAJ injection Inject 0.3 mg into the muscle as needed for anaphylaxis. 2 each 2   ondansetron (ZOFRAN-ODT) 4 MG disintegrating tablet Take 1 tablet (4 mg total) by mouth every 8 (eight) hours as needed for nausea or vomiting. 30 tablet 0   doxycycline (VIBRA-TABS) 100 MG tablet Take 1 tablet (100 mg total) by mouth 2 (two) times daily. With food (Patient not taking: Reported on 11/26/2023) 20 tablet 0   fluticasone (FLONASE) 50 MCG/ACT nasal spray Place into both nostrils daily. (Patient not taking: Reported on 11/26/2023)     hydrOXYzine (VISTARIL) 25 MG capsule Take 1-2 capsules (25-50 mg total) by mouth at bedtime as needed. (Patient not taking: Reported on 11/26/2023) 180 capsule 3   mupirocin ointment (BACTROBAN) 2 % Apply 1 application. topically 2 (two) times daily. Prn open wounds (Patient not taking: Reported on 11/26/2023) 30 g 0   No facility-administered medications prior to visit.    Review  of Systems;  Patient denies headache, fevers, malaise, unintentional weight loss, skin rash, eye pain, sinus congestion and sinus pain, sore throat, dysphagia,  hemoptysis , cough, dyspnea, wheezing, chest pain, palpitations, orthopnea, edema, abdominal pain, nausea, melena, diarrhea, constipation, flank pain, dysuria, hematuria, urinary  Frequency, nocturia, numbness, tingling, seizures,  Focal weakness, Loss of consciousness,  Tremor, insomnia, depression, anxiety, and suicidal ideation.      Objective:  BP 128/62   Pulse 68   Ht 5' 9.5" (1.765 m)   Wt 156 lb 6.4 oz (70.9 kg)   SpO2 99%   BMI 22.77 kg/m   BP Readings from Last 3 Encounters:  11/26/23 128/62  01/31/22 120/86  08/05/21 108/74    Wt Readings from Last 3 Encounters:  11/26/23 156 lb 6.4 oz (70.9 kg)  01/31/22 142 lb (64.4 kg)  08/05/21 140 lb 3.2 oz (63.6 kg)    Physical Exam  No results found for: "HGBA1C"  Lab Results  Component Value Date   CREATININE 0.92 07/29/2021   CREATININE 0.90 04/27/2020   CREATININE 0.90 04/30/2019    Lab Results  Component Value Date   WBC 9.7 07/29/2021   HGB 16.9 07/29/2021   HCT 48.5 07/29/2021   PLT 254 07/29/2021   GLUCOSE 76 07/29/2021   CHOL 135 07/29/2021   TRIG 105 07/29/2021   HDL 44 07/29/2021  LDLCALC 72 07/29/2021   ALT 9 07/29/2021   AST 18 07/29/2021   NA 143 07/29/2021   K 4.0 07/29/2021   CL 102 07/29/2021   CREATININE 0.92 07/29/2021   BUN 9 07/29/2021   CO2 22 07/29/2021   TSH 2.50 07/29/2021    CT Chest Wo Contrast Result Date: 02/16/2020 CLINICAL DATA:  Follow-up lung nodule EXAM: CT CHEST WITHOUT CONTRAST TECHNIQUE: Multidetector CT imaging of the chest was performed following the standard protocol without IV contrast. COMPARISON:  None. FINDINGS: Cardiovascular: Heart is normal size. Aorta is normal caliber. Mediastinum/Nodes: No mediastinal, hilar, or axillary adenopathy. Trachea and esophagus are unremarkable. Thyroid unremarkable.  Lungs/Pleura: 4 mm nodule in the posterior right lower lobe on image 145 of series 3. No additional pulmonary nodules. No confluent opacities or effusions. Upper Abdomen: There appears to be fullness of the collecting systems bilaterally in the visualized upper kidneys suggesting hydronephrosis, but not adequately visualized to confirm or determine cause. Small stones seen in the upper pole of the left kidney. Musculoskeletal: Chest wall soft tissues are unremarkable. No acute bony abnormality. IMPRESSION: 4 mm posterior right lower lobe pulmonary nodule. No follow-up needed if patient is low-risk. Non-contrast chest CT can be considered in 12 months if patient is high-risk. This recommendation follows the consensus statement: Guidelines for Management of Incidental Pulmonary Nodules Detected on CT Images: From the Fleischner Society 2017; Radiology 2017; 284:228-243. Apparent hydronephrosis seen in the upper poles of the kidneys bilaterally, but difficult to confirm or determine cause on this chest CT. Consider renal ultrasound or abdominal CT if felt clinically indicated. Left upper pole nephrolithiasis. Electronically Signed   By: Charlett Nose M.D.   On: 02/16/2020 08:45    Assessment & Plan:  .Dyslipidemia  Other fatigue  Nausea     I provided 30 minutes of face-to-face time during this encounter reviewing patient's last visit with me, patient's  most recent visit with cardiology,  nephrology,  and neurology,  recent surgical and non surgical procedures, previous  labs and imaging studies, counseling on currently addressed issues,  and post visit ordering to diagnostics and therapeutics .   Follow-up: No follow-ups on file.   Sherlene Shams, MD

## 2023-11-27 ENCOUNTER — Encounter: Payer: Self-pay | Admitting: Internal Medicine

## 2024-07-28 ENCOUNTER — Ambulatory Visit: Payer: Self-pay

## 2024-07-28 NOTE — Telephone Encounter (Signed)
 FYI Only or Action Required?: Action required by provider: update on patient condition.  Patient was last seen in primary care on 11/26/2023 by Marylynn Verneita CROME, MD.  Called Nurse Triage reporting Thrush.  Symptoms began about a month ago.  Interventions attempted: Prescription medications: nystatin - swish and spit.  Symptoms are: unchanged.  Triage Disposition: See PCP When Office is Open (Within 3 Days)  Patient/caregiver understands and will follow disposition?: Yes          Copied from CRM #[id number]. Topic: Clinical - Red Word Triage >> Jul 28, 2024  2:25 PM Nessti S wrote: [facility] that prompted transfer to Nurse Triage: thrush and ulcer on tongue Reason for Disposition  [1] MILD-MODERATE mouth pain AND [2] present > 3 days    Pt c/o chronic mouth sores and has gone thru several treatments of antifungals without relief.  Scheduled patient on the next available AV appt on August 04, 2024.  Answer Assessment - Initial Assessment Questions 1. ONSET: When did your mouth start hurting? (e.g., hours or days ago)      4 weeks - has been prescribed swish and spit 2. SEVERITY: How bad is the pain? (Scale 1-10; or mild, moderate or severe)     It still feels pretty bad 3. SORES: Are there any sores or ulcers in the mouth? If Yes, ask: What part of the mouth are the sores in?     Yes, mouth and tongue 4. FEVER: Do you have a fever? If Yes, ask: What is your temperature, how was it measured, and when did it start?     denies 5. CAUSE: What do you think is causing the mouth pain?     Thrush vs tongue ulcer 6. OTHER SYMPTOMS: Do you have any other symptoms? (e.g., difficulty breathing)     Headache - endorses newborn and life stress  Protocols used: Mouth Pain-A-AH

## 2024-08-04 ENCOUNTER — Encounter: Payer: Self-pay | Admitting: Internal Medicine

## 2024-08-04 ENCOUNTER — Ambulatory Visit: Admitting: Internal Medicine

## 2024-08-04 VITALS — BP 118/72 | HR 63 | Temp 97.7°F | Ht 69.5 in | Wt 151.0 lb

## 2024-08-04 DIAGNOSIS — K141 Geographic tongue: Secondary | ICD-10-CM | POA: Insufficient documentation

## 2024-08-04 DIAGNOSIS — T782XXA Anaphylactic shock, unspecified, initial encounter: Secondary | ICD-10-CM

## 2024-08-04 DIAGNOSIS — T782XXD Anaphylactic shock, unspecified, subsequent encounter: Secondary | ICD-10-CM | POA: Diagnosis not present

## 2024-08-04 MED ORDER — LIDOCAINE VISCOUS HCL 2 % MT SOLN: 15.0000 mL | Freq: Four times a day (QID) | OROMUCOSAL | 0 refills | Status: AC | PRN

## 2024-08-04 MED ORDER — EPINEPHRINE 0.3 MG/0.3ML IJ SOAJ: 0.3000 mg | INTRAMUSCULAR | 2 refills | Status: AC | PRN

## 2024-08-04 NOTE — Assessment & Plan Note (Signed)
-   Patient's symptoms began approximately 4 weeks ago with erythema and tenderness on the lateral aspect of his tongue.  This resolved but a few days later he developed similar symptoms at this time at the tip of his tongue. -Patient said the area is sore but otherwise asymptomatic.  No white plaques noted.  No bleeding. 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  However, needs to be differentiated from erythroplakia (less likely as patient had initial lesion on the lateral aspect of his tongue which resolved and now has a lesion at the tip of his tongue) -I have asked the patient to follow-up with a dentist for further evaluation -I prescribed viscous lidocaine  to help with the symptoms -No further workup at this time

## 2024-08-04 NOTE — Patient Instructions (Signed)
  VISIT SUMMARY: Today, you were seen for persistent tongue ulcers and fissures that have been present for four weeks. We discussed your symptoms, recent dental work, and your history of a heart murmur.  YOUR PLAN: -GEOGRAPHIC TONGUE: Geographic tongue is a condition where red, raised areas appear on the tongue and change in size and location. You have been experiencing tongue ulcers and fissures for four weeks. Continue using the nystatin swish and spit treatment three times daily for relief.  I will also prescribe viscous lidocaine  for you to help with symptoms.  Avoid acidic and sugary foods to prevent worsening of symptoms. You will be referred to a dentist for further evaluation of your tongue lesions.   INSTRUCTIONS: Please follow up with a dentist for further evaluation of your tongue lesions. Continue using the nystatin swish and spit treatment three times daily.  Will also prescribe viscous lidocaine  to help with your symptoms.  Avoid acidic and sugary foods to prevent symptom aggravation.                      Contains text generated by [employer].                                 Contains text generated by [employer]bridge.

## 2024-08-04 NOTE — Progress Notes (Signed)
 Acute Office Visit  Subjective:     Patient ID: David [name], male    DOB: 1990/11/09, 33 y.o.   MRN: [id number]  Chief Complaint  Patient presents with   Acute Visit    Chronic mouth sores x 4-5 weeks Ulcers come for about a week go away for 2 days then come right back   Discussed the use of AI scribe software for clinical note transcription with the patient, who gave verbal consent to proceed.  History of Present Illness David [name] is a 33 year old male who presents with persistent tongue ulcers and fissures for four weeks.  Oral ulcerations and tongue fissures - Persistent ulcers and fissures on the tongue for four weeks - Initial sore on the side of the tongue resolved; new sore developed on the anterior tongue - Ulcers are mildly tender, especially at the tip of the tongue, but not severely painful - No significant pain - No white layer on the tongue; no findings typical of oral candidiasis - Avoids acidic foods, sugary drinks, and white bread to prevent symptom aggravation  Response to treatment - Evaluated by ENT (not in person) and prescribed a 'swish and spit' treatment - Uses the treatment three times daily instead of the recommended four times daily due to convenience - Treatment provides some relief, possibly due to its viscosity  Recent dental procedures - Recent dental work including a crown and cavity filling - Dental procedures occurred prior to the onset of current symptoms  Cardiac history - History of heart murmur since high school.  Has a history of mitral valve prolapse    Review of Systems  Constitutional: Negative.  Negative for chills and fever.  HENT:  Negative for sore throat.        Patient complains of intermittent ulcerations, erythema and tenderness over areas of his tongue  Eyes: Negative.   Respiratory: Negative.    Cardiovascular: Negative.   Gastrointestinal: Negative.   Musculoskeletal: Negative.   Neurological:  Negative.   Psychiatric/Behavioral: Negative.          Objective:    BP 118/72   Pulse 63   Temp 97.7 F (36.5 C)   Ht 5' 9.5 (1.765 m)   Wt 151 lb (68.5 kg)   SpO2 98%   BMI 21.98 kg/m    Physical Exam Constitutional:      Appearance: Normal appearance.  HENT:     Head: Normocephalic and atraumatic.     Mouth/Throat:     Mouth: Mucous membranes are moist.     Pharynx: No oropharyngeal exudate.     Comments: Tip of the tongue is erythematous with no visible ulceration and no white plaques noted Cardiovascular:     Rate and Rhythm: Normal rate and regular rhythm.     Heart sounds: Murmur heard.  Pulmonary:     Effort: Pulmonary effort is normal.     Breath sounds: Normal breath sounds. No wheezing, rhonchi or rales.  Musculoskeletal:     Cervical back: Neck supple.  Lymphadenopathy:     Cervical: No cervical adenopathy.  Neurological:     Mental Status: He is alert.  Psychiatric:        Mood and Affect: Mood normal.        Behavior: Behavior normal.     No results found for any visits on 08/04/24.      Assessment & Plan:   Problem List Items Addressed This Visit       Digestive  Glossitis, benign migratory - Primary   - Patient's symptoms began approximately 4 weeks ago with erythema and tenderness on the lateral aspect of his tongue.  This resolved but a few days later he developed similar symptoms at this time at the tip of his tongue. -Patient said the area is sore but otherwise asymptomatic.  No white plaques noted.  No bleeding. 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    Dispense:  2 each    Refill:  2   lidocaine  (XYLOCAINE ) 2 % solution    Sig: Use as directed 15 mLs in the mouth or throat every 6 (six) hours as needed for mouth pain.    Dispense:  100 mL    Refill:  0    No follow-ups on file.  Merion Grimaldo, MD

## 2024-08-04 NOTE — Assessment & Plan Note (Signed)
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

## 2024-11-26 ENCOUNTER — Encounter: Payer: Managed Care, Other (non HMO) | Admitting: Internal Medicine
